# Patient Record
Sex: Male | Born: 1940 | Race: White | Hispanic: No | Marital: Married | Smoking: Former smoker
Health system: Southern US, Community
[De-identification: ages and names within clinical notes are randomized; demographics above are authoritative.]

## PROBLEM LIST (undated history)

## (undated) DIAGNOSIS — R569 Unspecified convulsions: Secondary | ICD-10-CM

## (undated) DIAGNOSIS — G8929 Other chronic pain: Secondary | ICD-10-CM

## (undated) DIAGNOSIS — I10 Essential (primary) hypertension: Secondary | ICD-10-CM

## (undated) DIAGNOSIS — M549 Dorsalgia, unspecified: Secondary | ICD-10-CM

---

## 2012-11-18 ENCOUNTER — Emergency Department (HOSPITAL_COMMUNITY): Payer: Medicare Other

## 2012-11-18 ENCOUNTER — Encounter (HOSPITAL_COMMUNITY): Payer: Self-pay | Admitting: Emergency Medicine

## 2012-11-18 ENCOUNTER — Emergency Department (HOSPITAL_COMMUNITY)
Admission: EM | Admit: 2012-11-18 | Discharge: 2012-11-18 | Disposition: A | Discharge status: Home / Self Care | Payer: Medicare Other | Attending: Emergency Medicine | Admitting: Emergency Medicine

## 2012-11-18 DIAGNOSIS — Z79899 Other long term (current) drug therapy: Secondary | ICD-10-CM | POA: Insufficient documentation

## 2012-11-18 DIAGNOSIS — Z87891 Personal history of nicotine dependence: Secondary | ICD-10-CM | POA: Insufficient documentation

## 2012-11-18 DIAGNOSIS — M62838 Other muscle spasm: Secondary | ICD-10-CM

## 2012-11-18 DIAGNOSIS — G8929 Other chronic pain: Secondary | ICD-10-CM | POA: Insufficient documentation

## 2012-11-18 DIAGNOSIS — M545 Low back pain, unspecified: Secondary | ICD-10-CM | POA: Insufficient documentation

## 2012-11-18 DIAGNOSIS — I10 Essential (primary) hypertension: Secondary | ICD-10-CM | POA: Insufficient documentation

## 2012-11-18 DIAGNOSIS — Z8669 Personal history of other diseases of the nervous system and sense organs: Secondary | ICD-10-CM | POA: Insufficient documentation

## 2012-11-18 DIAGNOSIS — Z7982 Long term (current) use of aspirin: Secondary | ICD-10-CM | POA: Insufficient documentation

## 2012-11-18 HISTORY — DX: Dorsalgia, unspecified: M54.9

## 2012-11-18 HISTORY — DX: Essential (primary) hypertension: I10

## 2012-11-18 HISTORY — DX: Other chronic pain: G89.29

## 2012-11-18 HISTORY — DX: Unspecified convulsions: R56.9

## 2012-11-18 LAB — POCT I-STAT, CHEM 8
Creatinine, Ser: 1.2 mg/dL (ref 0.50–1.35)
HCT: 44 % (ref 39.0–52.0)
Hemoglobin: 15 g/dL (ref 13.0–17.0)
Potassium: 4.4 mEq/L (ref 3.5–5.1)
Sodium: 140 mEq/L (ref 135–145)
TCO2: 31 mmol/L (ref 0–100)

## 2012-11-18 NOTE — ED Provider Notes (Signed)
History     CSN: [id number]  Arrival date & time 11/18/12  1511   First MD Initiated Contact with Patient 11/18/12 1541      Chief Complaint  Patient presents with  . Back Pain    HPI Pt was seen at 1600.  Per pt, c/o gradual onset and persistence of constant acute flair of his chronic low back "pain" for the past 1 week.  Denies any change in his usual chronic pain pattern.  Pain worsens with palpation of the area and body position changes. States the pain has increased since he has been attending physical therapy (as rx by his Neuro MD) over the past several weeks.  Pt states he feels his right lower thigh muscles occasionally "cramp up."  States this began after his Physical Therapist had him perform leg extensions "with heavier and heavier weights."  Denies incont/retention of bowel or bladder, no saddle anesthesia, no focal motor weakness, no tingling/numbness in extremities, no fevers, no injury, no abd pain.   The symptoms have been associated with no other complaints.     Past Medical History  Diagnosis Date  . Hypertension   . Seizures   . Chronic back pain     History reviewed. No pertinent past surgical history.   History  Substance Use Topics  . Smoking status: Former Games developer  . Smokeless tobacco: Not on file  . Alcohol Use: No    Review of Systems ROS: Statement: All systems negative except as marked or noted in the HPI; Constitutional: Negative for fever and chills. ; ; Eyes: Negative for eye pain, redness and discharge. ; ; ENMT: Negative for ear pain, hoarseness, nasal congestion, sinus pressure and sore throat. ; ; Cardiovascular: Negative for chest pain, palpitations, diaphoresis, dyspnea and peripheral edema. ; ; Respiratory: Negative for cough, wheezing and stridor. ; ; Gastrointestinal: Negative for nausea, vomiting, diarrhea, abdominal pain, blood in stool, hematemesis, jaundice and rectal bleeding. . ; ; Genitourinary: Negative for dysuria, flank pain and  hematuria. ; ; Musculoskeletal: +LBP, right thigh cramping. Negative for neck pain. Negative for swelling and trauma.; ; Skin: Negative for pruritus, rash, abrasions, blisters, bruising and skin lesion.; ; Neuro: Negative for headache, lightheadedness and neck stiffness. Negative for weakness, altered level of consciousness , altered mental status, extremity weakness, paresthesias, involuntary movement, seizure and syncope.      Allergies  Review of patient's allergies indicates no known allergies.  Home Medications   Current Outpatient Rx  Name  Route  Sig  Dispense  Refill  . aspirin EC 81 MG tablet   Oral   Take 81 mg by mouth every morning.         . Calcium Carb-Cholecalciferol (CALCIUM 600/VITAMIN D3) 600-800 MG-UNIT TABS   Oral   Take 1 tablet by mouth 2 (two) times daily.         . cyclobenzaprine (FLEXERIL) 5 MG tablet   Oral   Take 5 mg by mouth 2 (two) times daily.         Marland Kitchen lisinopril (PRINIVIL,ZESTRIL) 10 MG tablet   Oral   Take 10 mg by mouth every morning.         . traMADol (ULTRAM) 50 MG tablet   Oral   Take 50 mg by mouth every 12 (twelve) hours as needed for pain.         Marland Kitchen valproic acid (DEPAKENE) 250 MG capsule   Oral   Take 500 mg by mouth 2 (two) times daily.         Marland Kitchen  vitamin B-12 (CYANOCOBALAMIN) 1000 MCG tablet   Oral   Take 1,000 mcg by mouth every morning.           BP 145/73  Pulse 70  Temp(Src) 98.3 F (36.8 C) (Oral)  Resp 16  Ht 5\' 5"  (1.651 m)  Wt 185 lb (83.915 kg)  BMI 30.79 kg/m2  SpO2 93%  Physical Exam 1605: Physical examination:  Nursing notes reviewed; Vital signs and O2 SAT reviewed;  Constitutional: Well developed, Well nourished, Well hydrated, In no acute distress; Head:  Normocephalic, atraumatic; Eyes: EOMI, PERRL, No scleral icterus; ENMT: Mouth and pharynx normal, Mucous membranes moist; Neck: Supple, Full range of motion, No lymphadenopathy; Cardiovascular: Regular rate and rhythm, No gallop;  Respiratory: Breath sounds clear & equal bilaterally, No rales, rhonchi, wheezes.  Speaking full sentences with ease, Normal respiratory effort/excursion; Chest: Nontender, Movement normal; Abdomen: Soft, Nontender, Nondistended, Normal bowel sounds; Genitourinary: No CVA tenderness; Spine:  No midline CS, TS, LS tenderness. +mild TTP right lumbar paraspinal muscles.;; Extremities: Pulses normal, RLE muscles compartments soft. No edema, no ecchymosis, no erythema. NT right hip/knee/ankle/foot. Strong right pedal pulses, NMS intact right foot. No edema, No calf edema or asymmetry.; Neuro: AA&Ox3, Major CN grossly intact.  Speech clear. Strength 5/5 equal bilat UE's and LE's, including great toe dorsiflexion.  DTR 2/4 equal bilat UE's and LE's.  No gross sensory deficits.  Neg straight leg raises bilat. Climbs on and off stretcher by himself easily. Gait steady.;;; Skin: Color normal, Warm, Dry.   ED Course  Procedures    MDM  MDM Reviewed: previous chart, nursing note and vitals Interpretation: x-ray and labs   Results for orders placed during the hospital encounter of 11/18/12  POCT I-STAT, CHEM 8      Result Value Range   Sodium 140  135 - 145 mEq/L   Potassium 4.4  3.5 - 5.1 mEq/L   Chloride 102  96 - 112 mEq/L   BUN 27 (*) 6 - 23 mg/dL   Creatinine, Ser 1.61  0.50 - 1.35 mg/dL   Glucose, Bld 85  70 - 99 mg/dL   Calcium, Ion 0.96  0.45 - 1.30 mmol/L   TCO2 31  0 - 100 mmol/L   Hemoglobin 15.0  13.0 - 17.0 g/dL   HCT 40.9  81.1 - 91.4 %   Dg Lumbar Spine Complete 11/18/2012  *RADIOLOGY REPORT*  Clinical Data: Low back pain.  Right leg pain  LUMBAR SPINE - COMPLETE 4+ VIEW  Comparison: None.  Findings: Negative for fracture or mass.  Disc degeneration and spondylosis T12-L1, L1-2, L2-3, and L3-4.  Mild disc space narrowing L4-5 with bilateral facet degeneration.  Bilateral facet degeneration also noted at L5-S1.  Diffuse aortic calcification without aneurysm.  IMPRESSION: Moderate  degenerative change.  No acute bony abnormality.   Original Report Authenticated By: Janeece Riggers, M.D.      [phone]:  Neuro exam intact. Will have pt treat pain symptomatically at this time. Pt has gotten himself dressed and is sitting in a chair.  Wants to go home now.  Dx and testing d/w pt and family.  Questions answered.  Verb understanding, agreeable to d/c home with outpt f/u.          Laray Anger, DO 11/21/12 2224

## 2012-11-18 NOTE — ED Notes (Signed)
Pt c/o lower back pain and right thigh pain. Pt has been doing physical therapy and states the thigh pain began after his PT appointment.

## 2012-11-18 NOTE — ED Notes (Signed)
Pt c/o lower back pain x 1 week, denies injury.  Reports his neurologist sent him to an exercise facility.  Pt says pain is worse and radiates down  r leg.

## 2014-11-18 ENCOUNTER — Inpatient Hospital Stay (HOSPITAL_COMMUNITY)
Admission: EM | Admit: 2014-11-18 | Discharge: 2014-12-03 | DRG: 871 | Disposition: E | Payer: Medicare Other | Attending: Internal Medicine | Admitting: Internal Medicine

## 2014-11-18 ENCOUNTER — Inpatient Hospital Stay (HOSPITAL_COMMUNITY): Payer: Medicare Other

## 2014-11-18 ENCOUNTER — Other Ambulatory Visit (HOSPITAL_COMMUNITY): Payer: Self-pay

## 2014-11-18 ENCOUNTER — Emergency Department (HOSPITAL_COMMUNITY): Payer: Medicare Other

## 2014-11-18 ENCOUNTER — Encounter (HOSPITAL_COMMUNITY): Payer: Self-pay | Admitting: *Deleted

## 2014-11-18 DIAGNOSIS — I4891 Unspecified atrial fibrillation: Secondary | ICD-10-CM | POA: Diagnosis not present

## 2014-11-18 DIAGNOSIS — E874 Mixed disorder of acid-base balance: Secondary | ICD-10-CM | POA: Diagnosis present

## 2014-11-18 DIAGNOSIS — I10 Essential (primary) hypertension: Secondary | ICD-10-CM | POA: Diagnosis present

## 2014-11-18 DIAGNOSIS — Z452 Encounter for adjustment and management of vascular access device: Secondary | ICD-10-CM

## 2014-11-18 DIAGNOSIS — D689 Coagulation defect, unspecified: Secondary | ICD-10-CM | POA: Diagnosis not present

## 2014-11-18 DIAGNOSIS — Z789 Other specified health status: Secondary | ICD-10-CM | POA: Diagnosis present

## 2014-11-18 DIAGNOSIS — J9601 Acute respiratory failure with hypoxia: Secondary | ICD-10-CM | POA: Insufficient documentation

## 2014-11-18 DIAGNOSIS — A419 Sepsis, unspecified organism: Secondary | ICD-10-CM | POA: Diagnosis present

## 2014-11-18 DIAGNOSIS — M6282 Rhabdomyolysis: Secondary | ICD-10-CM | POA: Diagnosis present

## 2014-11-18 DIAGNOSIS — K7200 Acute and subacute hepatic failure without coma: Secondary | ICD-10-CM | POA: Diagnosis present

## 2014-11-18 DIAGNOSIS — Z7289 Other problems related to lifestyle: Secondary | ICD-10-CM | POA: Diagnosis present

## 2014-11-18 DIAGNOSIS — K760 Fatty (change of) liver, not elsewhere classified: Secondary | ICD-10-CM | POA: Diagnosis present

## 2014-11-18 DIAGNOSIS — Z7982 Long term (current) use of aspirin: Secondary | ICD-10-CM | POA: Diagnosis not present

## 2014-11-18 DIAGNOSIS — R945 Abnormal results of liver function studies: Secondary | ICD-10-CM

## 2014-11-18 DIAGNOSIS — D696 Thrombocytopenia, unspecified: Secondary | ICD-10-CM | POA: Diagnosis present

## 2014-11-18 DIAGNOSIS — J690 Pneumonitis due to inhalation of food and vomit: Secondary | ICD-10-CM | POA: Diagnosis present

## 2014-11-18 DIAGNOSIS — Z87891 Personal history of nicotine dependence: Secondary | ICD-10-CM

## 2014-11-18 DIAGNOSIS — E875 Hyperkalemia: Secondary | ICD-10-CM | POA: Diagnosis not present

## 2014-11-18 DIAGNOSIS — K7010 Alcoholic hepatitis without ascites: Secondary | ICD-10-CM | POA: Diagnosis present

## 2014-11-18 DIAGNOSIS — J189 Pneumonia, unspecified organism: Secondary | ICD-10-CM | POA: Diagnosis present

## 2014-11-18 DIAGNOSIS — Z66 Do not resuscitate: Secondary | ICD-10-CM | POA: Diagnosis not present

## 2014-11-18 DIAGNOSIS — Z4659 Encounter for fitting and adjustment of other gastrointestinal appliance and device: Secondary | ICD-10-CM

## 2014-11-18 DIAGNOSIS — L89152 Pressure ulcer of sacral region, stage 2: Secondary | ICD-10-CM | POA: Diagnosis present

## 2014-11-18 DIAGNOSIS — K7290 Hepatic failure, unspecified without coma: Secondary | ICD-10-CM | POA: Diagnosis present

## 2014-11-18 DIAGNOSIS — R6521 Severe sepsis with septic shock: Secondary | ICD-10-CM | POA: Diagnosis present

## 2014-11-18 DIAGNOSIS — Z79899 Other long term (current) drug therapy: Secondary | ICD-10-CM

## 2014-11-18 DIAGNOSIS — I248 Other forms of acute ischemic heart disease: Secondary | ICD-10-CM | POA: Diagnosis present

## 2014-11-18 DIAGNOSIS — N179 Acute kidney failure, unspecified: Secondary | ICD-10-CM | POA: Diagnosis present

## 2014-11-18 DIAGNOSIS — Z87898 Personal history of other specified conditions: Secondary | ICD-10-CM

## 2014-11-18 DIAGNOSIS — F1099 Alcohol use, unspecified with unspecified alcohol-induced disorder: Secondary | ICD-10-CM

## 2014-11-18 DIAGNOSIS — N289 Disorder of kidney and ureter, unspecified: Secondary | ICD-10-CM | POA: Insufficient documentation

## 2014-11-18 DIAGNOSIS — Z515 Encounter for palliative care: Secondary | ICD-10-CM | POA: Diagnosis not present

## 2014-11-18 DIAGNOSIS — Z978 Presence of other specified devices: Secondary | ICD-10-CM

## 2014-11-18 DIAGNOSIS — G8929 Other chronic pain: Secondary | ICD-10-CM | POA: Diagnosis present

## 2014-11-18 DIAGNOSIS — R7989 Other specified abnormal findings of blood chemistry: Secondary | ICD-10-CM | POA: Diagnosis present

## 2014-11-18 DIAGNOSIS — I370 Nonrheumatic pulmonary valve stenosis: Secondary | ICD-10-CM | POA: Diagnosis not present

## 2014-11-18 DIAGNOSIS — Z9289 Personal history of other medical treatment: Secondary | ICD-10-CM

## 2014-11-18 DIAGNOSIS — G40909 Epilepsy, unspecified, not intractable, without status epilepticus: Secondary | ICD-10-CM | POA: Diagnosis present

## 2014-11-18 DIAGNOSIS — M549 Dorsalgia, unspecified: Secondary | ICD-10-CM | POA: Diagnosis present

## 2014-11-18 DIAGNOSIS — F1090 Alcohol use, unspecified, uncomplicated: Secondary | ICD-10-CM | POA: Diagnosis present

## 2014-11-18 DIAGNOSIS — J9690 Respiratory failure, unspecified, unspecified whether with hypoxia or hypercapnia: Secondary | ICD-10-CM

## 2014-11-18 DIAGNOSIS — E869 Volume depletion, unspecified: Secondary | ICD-10-CM | POA: Diagnosis present

## 2014-11-18 DIAGNOSIS — R748 Abnormal levels of other serum enzymes: Secondary | ICD-10-CM

## 2014-11-18 DIAGNOSIS — T68XXXA Hypothermia, initial encounter: Secondary | ICD-10-CM | POA: Diagnosis present

## 2014-11-18 DIAGNOSIS — R778 Other specified abnormalities of plasma proteins: Secondary | ICD-10-CM | POA: Diagnosis present

## 2014-11-18 LAB — CBC WITH DIFFERENTIAL/PLATELET
Basophils Absolute: 0.1 10*3/uL (ref 0.0–0.1)
Basophils Relative: 1 % (ref 0–1)
EOS ABS: 0 10*3/uL (ref 0.0–0.7)
Eosinophils Relative: 0 % (ref 0–5)
HCT: 44.9 % (ref 39.0–52.0)
HEMOGLOBIN: 15.3 g/dL (ref 13.0–17.0)
LYMPHS ABS: 1.6 10*3/uL (ref 0.7–4.0)
LYMPHS PCT: 16 % (ref 12–46)
MCH: 32.8 pg (ref 26.0–34.0)
MCHC: 34.1 g/dL (ref 30.0–36.0)
MCV: 96.4 fL (ref 78.0–100.0)
Monocytes Absolute: 0.7 10*3/uL (ref 0.1–1.0)
Monocytes Relative: 7 % (ref 3–12)
Neutro Abs: 8 10*3/uL — ABNORMAL HIGH (ref 1.7–7.7)
Neutrophils Relative %: 76 % (ref 43–77)
PLATELETS: 122 10*3/uL — AB (ref 150–400)
RBC: 4.66 MIL/uL (ref 4.22–5.81)
RDW: 16 % — AB (ref 11.5–15.5)
WBC: 10.4 10*3/uL (ref 4.0–10.5)

## 2014-11-18 LAB — COMPREHENSIVE METABOLIC PANEL
ALT: 629 U/L — ABNORMAL HIGH (ref 0–53)
ANION GAP: 14 (ref 5–15)
AST: 1425 U/L — ABNORMAL HIGH (ref 0–37)
Albumin: 3 g/dL — ABNORMAL LOW (ref 3.5–5.2)
Alkaline Phosphatase: 309 U/L — ABNORMAL HIGH (ref 39–117)
BUN: 66 mg/dL — ABNORMAL HIGH (ref 6–23)
CO2: 25 mmol/L (ref 19–32)
CREATININE: 2.12 mg/dL — AB (ref 0.50–1.35)
Calcium: 13.6 mg/dL (ref 8.4–10.5)
Chloride: 103 mmol/L (ref 96–112)
GFR, EST AFRICAN AMERICAN: 34 mL/min — AB (ref >90)
GFR, EST NON AFRICAN AMERICAN: 29 mL/min — AB (ref >90)
Glucose, Bld: 97 mg/dL (ref 70–99)
Potassium: 4.7 mmol/L (ref 3.5–5.1)
SODIUM: 142 mmol/L (ref 135–145)
Total Bilirubin: 7.1 mg/dL — ABNORMAL HIGH (ref 0.3–1.2)
Total Protein: 6.2 g/dL (ref 6.0–8.3)

## 2014-11-18 LAB — RAPID URINE DRUG SCREEN, HOSP PERFORMED
AMPHETAMINES: NEGATIVE — AB
Barbiturates: NEGATIVE — AB
Benzodiazepines: NEGATIVE — AB
Cocaine: NEGATIVE — AB
OPIATES: NEGATIVE — AB
TETRAHYDROCANNABINOL: NEGATIVE — AB

## 2014-11-18 LAB — URINALYSIS, ROUTINE W REFLEX MICROSCOPIC
Glucose, UA: NEGATIVE mg/dL
KETONES UR: NEGATIVE mg/dL
LEUKOCYTES UA: NEGATIVE
Nitrite: NEGATIVE
PROTEIN: NEGATIVE mg/dL
Specific Gravity, Urine: >1.03 — ABNORMAL HIGH (ref 1.005–1.030)
Urobilinogen, UA: 0.2 mg/dL (ref 0.0–1.0)
pH: 5 (ref 5.0–8.0)

## 2014-11-18 LAB — URINE MICROSCOPIC-ADD ON

## 2014-11-18 LAB — TROPONIN I: TROPONIN I: 0.24 ng/mL — AB (ref <0.031)

## 2014-11-18 MED ORDER — DEXTROSE 5 % IV SOLN
1.0000 g | Freq: Once | INTRAVENOUS | Status: AC
  Administered 2014-11-18: 1 g via INTRAVENOUS
  Filled 2014-11-18: qty 10

## 2014-11-18 MED ORDER — MIDAZOLAM HCL 50 MG/10ML IJ SOLN
INTRAMUSCULAR | Status: AC
  Filled 2014-11-18: qty 1

## 2014-11-18 MED ORDER — CETYLPYRIDINIUM CHLORIDE 0.05 % MT LIQD
7.0000 mL | Freq: Two times a day (BID) | OROMUCOSAL | Status: DC
  Administered 2014-11-18: 7 mL via OROMUCOSAL

## 2014-11-18 MED ORDER — ASPIRIN EC 81 MG PO TBEC
81.0000 mg | DELAYED_RELEASE_TABLET | Freq: Every morning | ORAL | Status: DC
  Filled 2014-11-18: qty 1

## 2014-11-18 MED ORDER — IPRATROPIUM-ALBUTEROL 0.5-2.5 (3) MG/3ML IN SOLN
3.0000 mL | Freq: Once | RESPIRATORY_TRACT | Status: AC
  Administered 2014-11-18: 3 mL via RESPIRATORY_TRACT
  Filled 2014-11-18: qty 3

## 2014-11-18 MED ORDER — FENTANYL CITRATE 0.05 MG/ML IJ SOLN
50.0000 ug | INTRAMUSCULAR | Status: DC | PRN
Start: 2014-11-18 — End: 2014-11-21
  Administered 2014-11-19 (×3): 50 ug via INTRAVENOUS
  Filled 2014-11-18 (×3): qty 2

## 2014-11-18 MED ORDER — CHLORHEXIDINE GLUCONATE 0.12 % MT SOLN
15.0000 mL | Freq: Two times a day (BID) | OROMUCOSAL | Status: DC
  Administered 2014-11-19 – 2014-11-21 (×6): 15 mL via OROMUCOSAL
  Filled 2014-11-18 (×5): qty 15

## 2014-11-18 MED ORDER — ETOMIDATE 2 MG/ML IV SOLN
INTRAVENOUS | Status: AC
  Administered 2014-11-19: 20 mg
  Filled 2014-11-18: qty 20

## 2014-11-18 MED ORDER — ONDANSETRON HCL 4 MG/2ML IJ SOLN: 4.0000 mg | Freq: Four times a day (QID) | INTRAMUSCULAR | Status: DC | PRN

## 2014-11-18 MED ORDER — PIPERACILLIN-TAZOBACTAM 3.375 G IVPB
INTRAVENOUS | Status: AC
  Filled 2014-11-18: qty 50

## 2014-11-18 MED ORDER — ROCURONIUM BROMIDE 50 MG/5ML IV SOLN
INTRAVENOUS | Status: AC
  Filled 2014-11-18: qty 2

## 2014-11-18 MED ORDER — FOLIC ACID 1 MG PO TABS
1.0000 mg | ORAL_TABLET | Freq: Every day | ORAL | Status: DC
  Administered 2014-11-19 – 2014-11-21 (×3): 1 mg via ORAL
  Filled 2014-11-18 (×3): qty 1

## 2014-11-18 MED ORDER — ALBUTEROL SULFATE (2.5 MG/3ML) 0.083% IN NEBU
2.5000 mg | INHALATION_SOLUTION | RESPIRATORY_TRACT | Status: DC | PRN
  Administered 2014-11-18 – 2014-11-19 (×3): 2.5 mg via RESPIRATORY_TRACT
  Filled 2014-11-18 (×3): qty 3

## 2014-11-18 MED ORDER — LORAZEPAM 2 MG/ML IJ SOLN
1.0000 mg | Freq: Four times a day (QID) | INTRAMUSCULAR | Status: DC | PRN
  Filled 2014-11-18: qty 1

## 2014-11-18 MED ORDER — SODIUM CHLORIDE 0.9 % IJ SOLN
3.0000 mL | Freq: Two times a day (BID) | INTRAMUSCULAR | Status: DC
  Administered 2014-11-18 – 2014-11-20 (×4): 3 mL via INTRAVENOUS

## 2014-11-18 MED ORDER — MIDAZOLAM HCL 2 MG/2ML IJ SOLN: 1.0000 mg | INTRAMUSCULAR | Status: DC | PRN

## 2014-11-18 MED ORDER — HEPARIN SODIUM (PORCINE) 5000 UNIT/ML IJ SOLN
5000.0000 [IU] | Freq: Three times a day (TID) | INTRAMUSCULAR | Status: DC
  Administered 2014-11-19 – 2014-11-21 (×8): 5000 [IU] via SUBCUTANEOUS
  Filled 2014-11-18 (×9): qty 1

## 2014-11-18 MED ORDER — ADULT MULTIVITAMIN W/MINERALS CH
1.0000 | ORAL_TABLET | Freq: Every day | ORAL | Status: DC
  Administered 2014-11-19 – 2014-11-21 (×3): 1 via ORAL
  Filled 2014-11-18 (×3): qty 1

## 2014-11-18 MED ORDER — PANTOPRAZOLE SODIUM 40 MG IV SOLR
40.0000 mg | Freq: Two times a day (BID) | INTRAVENOUS | Status: DC
  Administered 2014-11-19 – 2014-11-21 (×6): 40 mg via INTRAVENOUS
  Filled 2014-11-18 (×7): qty 40

## 2014-11-18 MED ORDER — SUCCINYLCHOLINE CHLORIDE 20 MG/ML IJ SOLN
INTRAMUSCULAR | Status: AC
  Administered 2014-11-19: 120 mg
  Filled 2014-11-18: qty 1

## 2014-11-18 MED ORDER — LIDOCAINE HCL (CARDIAC) 20 MG/ML IV SOLN
INTRAVENOUS | Status: AC
  Filled 2014-11-18: qty 5

## 2014-11-18 MED ORDER — VANCOMYCIN HCL IN DEXTROSE 1-5 GM/200ML-% IV SOLN
1000.0000 mg | Freq: Once | INTRAVENOUS | Status: AC
Start: 2014-11-18 — End: 2014-11-19
  Administered 2014-11-19: 1000 mg via INTRAVENOUS
  Filled 2014-11-18: qty 200

## 2014-11-18 MED ORDER — CETYLPYRIDINIUM CHLORIDE 0.05 % MT LIQD
7.0000 mL | Freq: Four times a day (QID) | OROMUCOSAL | Status: DC
  Administered 2014-11-19 – 2014-11-21 (×11): 7 mL via OROMUCOSAL

## 2014-11-18 MED ORDER — FENTANYL CITRATE 0.05 MG/ML IJ SOLN: 50.0000 ug | INTRAMUSCULAR | Status: DC | PRN

## 2014-11-18 MED ORDER — PIPERACILLIN-TAZOBACTAM 3.375 G IVPB
3.3750 g | Freq: Three times a day (TID) | INTRAVENOUS | Status: DC
  Administered 2014-11-19 – 2014-11-21 (×8): 3.375 g via INTRAVENOUS
  Filled 2014-11-18 (×14): qty 50

## 2014-11-18 MED ORDER — DEXTROSE-NACL 5-0.9 % IV SOLN
INTRAVENOUS | Status: DC
  Administered 2014-11-18 – 2014-11-19 (×3): via INTRAVENOUS

## 2014-11-18 MED ORDER — CALCITONIN (SALMON) 200 UNIT/ML IJ SOLN
50.0000 [IU] | Freq: Once | INTRAMUSCULAR | Status: AC
  Administered 2014-11-18: 50 [IU] via SUBCUTANEOUS

## 2014-11-18 MED ORDER — SODIUM CHLORIDE 0.9 % IV BOLUS (SEPSIS): 1000.0000 mL | Freq: Once | INTRAVENOUS | Status: DC

## 2014-11-18 MED ORDER — THIAMINE HCL 100 MG/ML IJ SOLN
100.0000 mg | Freq: Every day | INTRAMUSCULAR | Status: DC
  Administered 2014-11-19: 100 mg via INTRAVENOUS
  Filled 2014-11-18 (×3): qty 1

## 2014-11-18 MED ORDER — CALCITONIN (SALMON) 200 UNIT/ML IJ SOLN
50.0000 [IU] | Freq: Once | INTRAMUSCULAR | Status: DC
  Filled 2014-11-18: qty 0.25

## 2014-11-18 MED ORDER — VANCOMYCIN HCL IN DEXTROSE 1-5 GM/200ML-% IV SOLN
INTRAVENOUS | Status: AC
Start: 2014-11-18 — End: 2014-11-18
  Filled 2014-11-18: qty 200

## 2014-11-18 MED ORDER — SODIUM CHLORIDE 0.9 % IV BOLUS (SEPSIS)
1000.0000 mL | Freq: Once | INTRAVENOUS | Status: AC
  Administered 2014-11-18: 1000 mL via INTRAVENOUS

## 2014-11-18 MED ORDER — VITAMIN B-1 100 MG PO TABS
100.0000 mg | ORAL_TABLET | Freq: Every day | ORAL | Status: DC
  Administered 2014-11-20 – 2014-11-21 (×2): 100 mg via ORAL
  Filled 2014-11-18 (×2): qty 1

## 2014-11-18 MED ORDER — DEXTROSE 5 % IV SOLN
500.0000 mg | Freq: Once | INTRAVENOUS | Status: AC
  Administered 2014-11-18: 500 mg via INTRAVENOUS
  Filled 2014-11-18: qty 500

## 2014-11-18 MED ORDER — ONDANSETRON HCL 4 MG PO TABS: 4.0000 mg | ORAL_TABLET | Freq: Four times a day (QID) | ORAL | Status: DC | PRN

## 2014-11-18 MED ORDER — LORAZEPAM 1 MG PO TABS: 1.0000 mg | ORAL_TABLET | Freq: Four times a day (QID) | ORAL | Status: DC | PRN

## 2014-11-18 NOTE — H&P (Signed)
[facility] History and Physical  Jeffrey Wolf ZOX:[id number] DOB: 1941/05/12    PCP:   ADDIS,DANIEL, DO   Chief Complaint: Found down at home for unclear amount of time.  HPI: Jeffrey Wolf is an 74 y.o. male with very scanty history, as he was found down when neighbor asked EMS to check on his well being as they hadn't seen him.  EMS arrived, found him down, covered with urine and feces.  There was no family around and he lives alone.  When I saw him, he was able to answer some questions, knows his name, said he drinks Man[place]av[place], not daily, but a little confused.  He said he had hx of seizure, but a long time ago, and that he is not taking any medications.  In the ER, he was found to be hyperthermic, with T of 94.1.  Further ER evaluation showed that his calcium was 13, WBC of 10K, normal Hb, and platelet count of 120K.  His Cr was elevated to 2.12 with BUN of 66.  His LFTs were elevated with AST of 1425, ALT 629, APhos 309, and total bili 7.1.  His CXR showed possible pul edema vs airspace disease. He has troponin of 0.24, with EKG showing ST, but no acute ST T changes.  He was given IVF, Bear hugger, IV Rocephin and IV Zithromax, and hospitalist was asked to admit him for further evalaution and Tx.    Rewiew of Systems:  Constitutional: Negative for malaise, fever and chills. No significant weight loss or weight gain Eyes: Negative for eye pain, redness and discharge, diplopia, visual changes, or flashes of light. ENMT: Negative for ear pain, hoarseness, nasal congestion, sinus pressure and sore throat. No headaches; tinnitus, drooling, or problem swallowing. Cardiovascular: Negative for chest pain, palpitations, diaphoresis, dyspnea and peripheral edema. ; No orthopnea, PND Respiratory: Negative for cough, hemoptysis, wheezing and stridor. No pleuritic chestpain. Gastrointestinal: Negative for nausea, vomiting, diarrhea, constipation, abdominal pain, melena, blood in stool,  hematemesis, jaundice and rectal bleeding.    Genitourinary: Negative for frequency, dysuria, incontinence,flank pain and hematuria; Musculoskeletal: Negative for back pain and neck pain. Negative for swelling and trauma.;  Skin: . Negative for pruritus, rash, abrasions, bruising and skin lesion.; ulcerations Neuro: Negative for headache, lightheadedness and neck stiffness. Negative for weakness, altered level of consciousness , altered mental status, extremity weakness, burning feet, involuntary movement, seizure and syncope.  Psych: negative for anxiety, depression, insomnia, tearfulness, panic attacks, hallucinations, paranoia, suicidal or homicidal ideation    Past Medical History  Diagnosis Date  . Hypertension   . Seizures   . Chronic back pain    HOME MEDS: Prior to Admission medications   Medication Sig Start Date End Date Taking? Authorizing Provider  aspirin EC 81 MG tablet Take 81 mg by mouth every morning.    Historical Provider, MD  Calcium Carb-Cholecalciferol (CALCIUM 600/VITAMIN D3) 600-800 MG-UNIT TABS Take 1 tablet by mouth 2 (two) times daily.    Historical Provider, MD  cyclobenzaprine (FLEXERIL) 5 MG tablet Take 5 mg by mouth 2 (two) times daily.    Historical Provider, MD  lisinopril (PRINIVIL,ZESTRIL) 10 MG tablet Take 10 mg by mouth every morning.    Historical Provider, MD  traMADol (ULTRAM) 50 MG tablet Take 50 mg by mouth every 12 (twelve) hours as needed for pain.    Historical Provider, MD  valproic acid (DEPAKENE) 250 MG capsule Take 500 mg by mouth 2 (two) times daily.    Historical Provider, MD  vitamin  B-12 (CYANOCOBALAMIN) 1000 MCG tablet Take 1,000 mcg by mouth every morning.    Historical Provider, MD     Allergies:  No Known Allergies  Social History:   reports that he has quit smoking. He does not have any smokeless tobacco history on file. He reports that he does  drink alcohol   Family History: History reviewed. No pertinent family  history.   Physical Exam: Filed Vitals:   29-Nov-2014 1930 2014-11-29 1941 2014/11/29 2030 November 29, 2014 2054  BP: 120/62  102/44   Pulse: 111  113   Temp:    98.3 F (36.8 C)  TempSrc:    Core (Comment)  Resp:      SpO2: 94% 98% 94%    Blood pressure 102/44, pulse 113, temperature 98.3 F (36.8 C), temperature source Core (Comment), resp. rate 28, SpO2 94 %.  GEN:  Pleasant  patient lying in the stretcher in no acute distress; cooperative with exam. PSYCH:  alert and oriented x4; does not appear anxious or depressed; affect is appropriate. HEENT: Mucous membranes pink and anicteric; PERRLA; EOM intact; no cervical lymphadenopathy nor thyromegaly or carotid bruit; no JVD; There were no stridor. Neck is very supple. Breasts:: Not examined CHEST WALL: No tenderness CHEST: Normal respiration, BS is coarse. HEART: Tachy.  There are no murmur, rub, or gallops.   BACK: No kyphosis or scoliosis; no CVA tenderness ABDOMEN: soft and non-tender; no masses, no organomegaly, normal abdominal bowel sounds; no pannus; no intertriginous candida. There is no rebound and no distention. Rectal Exam: Not done EXTREMITIES: No bone or joint deformity; age-appropriate arthropathy of the hands and knees; no edema; no ulcerations.  There is no calf tenderness. Genitalia: not examined PULSES: 2+ and symmetric SKIN: Normal hydration no rash or ulceration CNS: Cranial nerves 2-12 grossly intact no focal lateralizing neurologic deficit.  Speech is fluent; uvula elevated with phonation, facial symmetry and tongue midline. DTR are normal bilaterally, cerebella exam is intact, barbinski is negative and strengths are equaled bilaterally.  No sensory loss.   Labs on Admission:  Basic Metabolic Panel:  Recent Labs Lab November 29, 2014 1842  NA 142  K 4.7  CL 103  CO2 25  GLUCOSE 97  BUN 66*  CREATININE 2.12*  CALCIUM 13.6*   Liver Function Tests:  Recent Labs Lab 2014/11/29 1842  AST 1425*  ALT 629*  ALKPHOS 309*   BILITOT 7.1*  PROT 6.2  ALBUMIN 3.0*  CBC:  Recent Labs Lab 2014-11-29 1842  WBC 10.4  NEUTROABS 8.0*  HGB 15.3  HCT 44.9  MCV 96.4  PLT 122*   Cardiac Enzymes:  Recent Labs Lab 2014/11/29 1842  TROPONINI 0.24*    CBG: No results for input(s): GLUCAP in the last 168 hours.   Radiological Exams on Admission: Ct Abdomen Pelvis Wo Contrast  29-Nov-2014   CLINICAL DATA:  Elevated liver function tests.  EXAM: CT ABDOMEN AND PELVIS WITHOUT CONTRAST  TECHNIQUE: Multidetector CT imaging of the abdomen and pelvis was performed following the standard protocol without IV contrast.  COMPARISON:  None.  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 Mild right posterior basilar opacity is noted concerning for pneumonia or subsegmental atelectasis.  Liver measures 26 cm in diameter consistent with hepatomegaly. The spleen and pancreas appear normal. No definite gallstones or gallbladder inflammation is noted. Adrenal glands and kidneys appear normal. No hydronephrosis or renal obstruction is noted. Atherosclerotic calcifications of abdominal aorta are noted without aneurysm formation. There is no evidence of bowel obstruction.  Sigmoid diverticulosis is noted without inflammation. Urinary bladder is decompressed secondary to Foley catheter. No abnormal fluid collection is noted. No significant adenopathy is noted.  IMPRESSION: Hepatomegaly is noted without focal abnormality seen. Right lower lobe pneumonia or atelectasis is noted. Sigmoid diverticulosis is noted without inflammation.   Electronically Signed   By: Lupita Raider, M.D.   On: 11/13/2014 20:16   Ct Head Wo Contrast  11/20/2014   CLINICAL DATA:  Altered mental status.  EXAM: CT HEAD WITHOUT CONTRAST  TECHNIQUE: Contiguous axial images were obtained from the base of the skull through the vertex without intravenous contrast.  COMPARISON:  None.  FINDINGS: Bony calvarium appears intact. Mild diffuse cortical  atrophy is noted. Minimal chronic ischemic white matter disease is noted. No mass effect or midline shift is noted. Ventricular size is within normal limits. There is no evidence of mass lesion, hemorrhage or acute infarction.  IMPRESSION: Mild diffuse cortical atrophy. Minimal chronic ischemic white matter disease. No acute intracranial abnormality seen.   Electronically Signed   By: Lupita Raider, M.D.   On: 11/27/2014 19:26   Dg Chest Port 1 View  11/03/2014   CLINICAL DATA:  Hypothermia, altered mental status  EXAM: PORTABLE CHEST - 1 VIEW  COMPARISON:  None.  FINDINGS: Bilateral interstitial and alveolar airspace opacities. No pleural effusion or pneumothorax. Normal cardiomediastinal silhouette. 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  IMPRESSION: Bilateral interstitial and alveolar airspace opacities which may reflect mild pulmonary edema versus infection.   Electronically Signed   By: Elige Ko   On: 11/26/2014 18:49    EKG: Independently reviewed. ST at 110, no acute ST T changes.    Assessment/Plan  PLAN:  Patient was found down at home, presenting with clinical picture of sepsis, likely from pneumonia, with possible aspiration.  He has elevated LFTs, and it is possible he may have had a seizure.   His slight elevation of troponin likely from sepsis.  Will admit him for further work up.  See problem list:  Present on Admission:  . Sepsis:  Need to broaden antibiotic coverage:  Will give [employer].  He could have aspiration PNA.  CT of the abdomen did not show anything acute, but did show infiltrate in the lung.   Marland Kitchen HTN (hypertension):  BP is soft.  If it is lower, and IVF is not holding him, will start dopamine.  Will hold Lisionopril due to AKI, and hypotension.  . Alcohol use:  CIWA, and follow LFT tomorrow.  Hepatitis panel sent, along with NH3. I don't think he has hepatorenal syndrome.  . Community acquired pneumonia:  He was given Rocephin and Zithromax, but  I changed to Van/Zosyn, as I think he may have aspiration PNA. And wanted broader coverage.   . Elevated liver function tests:  Consider GI consult.  Will obtain RUQ [place] as well.  Could be from his alcohol use.  Will obtain tylenol level.   . Hypothermia: Sepsis likely. Could be from seizure as well.  Marland Kitchen AKI (acute kidney injury):  Suspect this will recover with holding Lisinopril and give IVF>  . Elevated troponin:  Doesn't fit ACS, likely sepsis.  Will follow troponin levels.  Obtain ECHO.  Possible seizure:  Will get EEG.  He said he had been off seizure medication a long time.  Will hold off on Valproic acid, check level.   Consult neurology.  Obtain more info when available.   Other plans as per orders.  Code Status:  FULL Unk LightningODE.    Flynn Gwyn, MD. [facility] Pager [phone] 7pm to 7am.  12/01/2014, 9:07 PM

## 2014-11-18 NOTE — ED Notes (Signed)
Bair Hugger stopped at this time. Patients core temperature is 98.9 at this time

## 2014-11-18 NOTE — ED Notes (Signed)
Pt has 2 stage 1-2 pressure ulcers above the sacrum, approx 1.5 inch diameter.

## 2014-11-18 NOTE — ED Provider Notes (Signed)
11:24 PM 11/08/2014 I was called to ICU to intubate patient for respiratory failure On arrival, pt obtunded, tachypneic and hypoxic   INTUBATION Performed by: Joya GaskinsWICKLINE,Jannetta Massey W  Required items: required devices, and special equipment available Patient identity confirmed: provided demographic data and hospital-assigned identification number Time out: deferred due to emergent procedure  Indications: respiratory failure  Intubation method: Glidescope Laryngoscopy   Preoxygenation: BVM  Sedatives: Etomidate Paralytic: Succinylcholine  Tube Size: 7.5 cuffed  Post-procedure assessment: chest rise and ETCO2 monitor Breath sounds: equal and absent over the epigastrium Tube secured with: ETT holder Patient tolerated the procedure well with no immediate complications.     Zadie Rhineonald Promise Weldin, MD 11/30/2014 [phone]

## 2014-11-18 NOTE — ED Notes (Signed)
Pt brought to ER by EMS after doing a well check. The pt was found in the floor beside of his bed, soiled and wet with feces and urine for unknown amt of time. The pt is oriented to name and DOB but disoriented to situation. Pt denies pain, tongue and oral mucosa very pale and dry. CBG 98.

## 2014-11-18 NOTE — ED Notes (Signed)
CRITICAL VALUE ALERT  Critical value received:  Calcium 13.6  Date of notification:  Sep 12, 2014  Time of notification:  1935  Critical value read back: yes  Nurse who received alert:  Juanita LasterAmanda Issaac Shipper, RN  MD notified (1st page):  Adriana Simasook  Time of first page:  1941  MD notified (2nd page):  Time of second page:  Responding MD:  Adriana Simasook  Time MD responded:  [phone]

## 2014-11-18 NOTE — Progress Notes (Signed)
Called to see patient re increase work of breathing. He is breathing at 40 x per minutes. Sat was at 86 %, BP 110, HR 120. He was having copious sputum. He has impending respiratory failure. Will request EDP to intubate him.

## 2014-11-18 NOTE — ED Provider Notes (Signed)
CSN: [id number]     Arrival date & time 11/17/2014  1745 History   First MD Initiated Contact with Patient 11/20/2014 1750     Chief Complaint  Patient presents with  . Altered Mental Status     (Consider location/radiation/quality/duration/timing/severity/associated sxs/prior Treatment) HPI.... Level V caveat for urgent need for intervention. Patient found on floor at home. He was incontinent of feces and urine. Unknown past medical history. Patient is unable to give history.  Past Medical History  Diagnosis Date  . Hypertension   . Seizures   . Chronic back pain    History reviewed. No pertinent past surgical history. History reviewed. No pertinent family history. History  Substance Use Topics  . Smoking status: Former Games developer  . Smokeless tobacco: Not on file  . Alcohol Use: No    Review of Systems  Unable to perform ROS: Acuity of condition      Allergies  Review of patient's allergies indicates no known allergies.  Home Medications   Prior to Admission medications   Medication Sig Start Date End Date Taking? Authorizing Provider  aspirin EC 81 MG tablet Take 81 mg by mouth every morning.    Historical Provider, MD  Calcium Carb-Cholecalciferol (CALCIUM 600/VITAMIN D3) 600-800 MG-UNIT TABS Take 1 tablet by mouth 2 (two) times daily.    Historical Provider, MD  cyclobenzaprine (FLEXERIL) 5 MG tablet Take 5 mg by mouth 2 (two) times daily.    Historical Provider, MD  lisinopril (PRINIVIL,ZESTRIL) 10 MG tablet Take 10 mg by mouth every morning.    Historical Provider, MD  traMADol (ULTRAM) 50 MG tablet Take 50 mg by mouth every 12 (twelve) hours as needed for pain.    Historical Provider, MD  valproic acid (DEPAKENE) 250 MG capsule Take 500 mg by mouth 2 (two) times daily.    Historical Provider, MD  vitamin B-12 (CYANOCOBALAMIN) 1000 MCG tablet Take 1,000 mcg by mouth every morning.    Historical Provider, MD   BP 104/60 mmHg  Pulse 114  Temp(Src) 98.9 F (37.2 C)  (Core (Comment))  Resp 28  SpO2 94% Physical Exam  Constitutional:  Hypothermic, appears ill  HENT:  Head: Normocephalic and atraumatic.  Eyes: Conjunctivae and EOM are normal. Pupils are equal, round, and reactive to light.  Neck: Normal range of motion. Neck supple.  Cardiovascular: Normal rate and regular rhythm.   Pulmonary/Chest: Effort normal.  Decreased breath sounds bilaterally  Abdominal: Soft. Bowel sounds are normal.  Musculoskeletal: Normal range of motion.  Neurological:  Able to speak  Skin:  Sacral decubitus ulcer  Psychiatric:  Flat affect  Nursing note and vitals reviewed.   ED Course  Procedures (including critical care time) Labs Review Labs Reviewed  URINALYSIS, ROUTINE W REFLEX MICROSCOPIC - Abnormal; Notable for the following:    Specific Gravity, Urine >1.030 (*)    Hgb urine dipstick MODERATE (*)    Bilirubin Urine MODERATE (*)    All other components within normal limits  COMPREHENSIVE METABOLIC PANEL - Abnormal; Notable for the following:    BUN 66 (*)    Creatinine, Ser 2.12 (*)    Calcium 13.6 (*)    Albumin 3.0 (*)    AST 1425 (*)    ALT 629 (*)    Alkaline Phosphatase 309 (*)    Total Bilirubin 7.1 (*)    GFR calc non Af Amer 29 (*)    GFR calc Af Amer 34 (*)    All other components within normal limits  TROPONIN  I - Abnormal; Notable for the following:    Troponin I 0.24 (*)    All other components within normal limits  CBC WITH DIFFERENTIAL/PLATELET - Abnormal; Notable for the following:    RDW 16.0 (*)    Platelets 122 (*)    Neutro Abs 8.0 (*)    All other components within normal limits  URINE MICROSCOPIC-ADD ON - Abnormal; Notable for the following:    Bacteria, UA FEW (*)    Casts HYALINE CASTS (*)    All other components within normal limits  CULTURE, BLOOD (ROUTINE X 2)  CULTURE, BLOOD (ROUTINE X 2)    Imaging Review Ct Abdomen Pelvis Wo Contrast  11/20/2014   CLINICAL DATA:  Elevated liver function tests.  EXAM: CT  ABDOMEN AND PELVIS WITHOUT CONTRAST  TECHNIQUE: Multidetector CT imaging of the abdomen and pelvis was performed following the standard protocol without IV contrast.  COMPARISON:  None.  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 Mild right posterior basilar opacity is noted concerning for pneumonia or subsegmental atelectasis.  Liver measures 26 cm in diameter consistent with hepatomegaly. The spleen and pancreas appear normal. No definite gallstones or gallbladder inflammation is noted. Adrenal glands and kidneys appear normal. No hydronephrosis or renal obstruction is noted. Atherosclerotic calcifications of abdominal aorta are noted without aneurysm formation. There is no evidence of bowel obstruction. Sigmoid diverticulosis is noted without inflammation. Urinary bladder is decompressed secondary to Foley catheter. No abnormal fluid collection is noted. No significant adenopathy is noted.  IMPRESSION: Hepatomegaly is noted without focal abnormality seen. Right lower lobe pneumonia or atelectasis is noted. Sigmoid diverticulosis is noted without inflammation.   Electronically Signed   By: Lupita Raider, M.D.   On: 11/04/2014 20:16   Ct Head Wo Contrast  11/20/2014   CLINICAL DATA:  Altered mental status.  EXAM: CT HEAD WITHOUT CONTRAST  TECHNIQUE: Contiguous axial images were obtained from the base of the skull through the vertex without intravenous contrast.  COMPARISON:  None.  FINDINGS: Bony calvarium appears intact. Mild diffuse cortical atrophy is noted. Minimal chronic ischemic white matter disease is noted. No mass effect or midline shift is noted. Ventricular size is within normal limits. There is no evidence of mass lesion, hemorrhage or acute infarction.  IMPRESSION: Mild diffuse cortical atrophy. Minimal chronic ischemic white matter disease. No acute intracranial abnormality seen.   Electronically Signed   By: Lupita Raider, M.D.   On: 11/25/2014 19:26   Dg  Chest Port 1 View  12/02/2014   CLINICAL DATA:  Hypothermia, altered mental status  EXAM: PORTABLE CHEST - 1 VIEW  COMPARISON:  None.  FINDINGS: Bilateral interstitial and alveolar airspace opacities. No pleural effusion or pneumothorax. Normal cardiomediastinal silhouette. 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  IMPRESSION: Bilateral interstitial and alveolar airspace opacities which may reflect mild pulmonary edema versus infection.   Electronically Signed   By: Elige Ko   On: 11/20/2014 18:49     EKG Interpretation   Date/Time:  Thursday November 18 2014 17:50:35 EDT Ventricular Rate:  106 PR Interval:  133 QRS Duration: 86 QT Interval:  336 QTC Calculation: 446 R Axis:   -70 Text Interpretation:  Sinus tachycardia Abnormal R-wave progression, early  transition Inferior infarct, old Baseline wander in lead(s) V6 Confirmed  by Delois Tolbert  MD, Marenda Accardi ([id number]) on 11/15/2014 6:44:45 PM     CRITICAL CARE Performed by: Donnetta Hutching Total critical care time: 50 Critical care time was exclusive  of separately billable procedures and treating other patients. Critical care was necessary to treat or prevent imminent or life-threatening deterioration. Critical care was time spent personally by me on the following activities: development of treatment plan with patient and/or surrogate as well as nursing, discussions with consultants, evaluation of patient's response to treatment, examination of patient, obtaining history from patient or surrogate, ordering and performing treatments and interventions, ordering and review of laboratory studies, ordering and review of radiographic studies, pulse oximetry and re-evaluation of patient's condition. MDM   Final diagnoses:  Hypothermia  Elevated liver function tests  Community acquired pneumonia  Hypercalcemia  Renal insufficiency        Donnetta HutchingBrian Nahsir Venezia, MD March 26, 2015 2159

## 2014-11-18 NOTE — Progress Notes (Signed)
Upon arrival to [facility] pt vital was hr 120's rr 50 spo2 85% pt in respiratory distress and labored breathing and decreased LOC. BS rhonchus. I suctioned and obtained copious amount of thick yellow/green secretions. Pt coughing up the same secretions. Nurse and I decided that the MD need to be called about the patient's declining in condition. MD come to assess pt and the decision made to intubation. Pt placed on ventilator and ABG pending

## 2014-11-18 NOTE — Progress Notes (Signed)
ANTIBIOTIC CONSULT NOTE   Pharmacy Consult for Vancomycin  Indication: pneumonia, sepsis  No Known Allergies  Patient Measurements: Height: 6' (182.9 cm) Weight: 173 lb 11.6 oz (78.8 kg) IBW/kg (Calculated) : 77.6  Vital Signs: Temp: 98.9 F (37.2 C) (03/17 2131) Temp Source: Core (Comment) (03/17 2131) BP: 118/65 mmHg (03/17 2200) Pulse Rate: 115 (03/17 2200) Intake/Output from previous day:   Intake/Output from this shift: Total I/O In: 3300 [I.V.:3300] Out: 125 [Urine:125]  Labs:  Recent Labs  12/02/2014 1842  WBC 10.4  HGB 15.3  PLT 122*  CREATININE 2.12*   Estimated Creatinine Clearance: 33.6 mL/min (by C-G formula based on Cr of 2.12). No results for input(s): VANCOTROUGH, VANCOPEAK, VANCORANDOM, GENTTROUGH, GENTPEAK, GENTRANDOM, TOBRATROUGH, TOBRAPEAK, TOBRARND, AMIKACINPEAK, AMIKACINTROU, AMIKACIN in the last 72 hours.   Microbiology: Recent Results (from the past 720 hour(s))  Blood culture (routine x 2)     Status: None (Preliminary result)   Collection Time: 11/11/2014  8:55 PM  Result Value Ref Range Status   Specimen Description RIGHT ANTECUBITAL  Final   Special Requests BOTTLES DRAWN AEROBIC AND ANAEROBIC 6CC  Final   Culture PENDING  Incomplete   Report Status PENDING  Incomplete  Blood culture (routine x 2)     Status: None (Preliminary result)   Collection Time: 11/09/2014  9:05 PM  Result Value Ref Range Status   Specimen Description BLOOD RIGHT HAND  Final   Special Requests BOTTLES DRAWN AEROBIC AND ANAEROBIC 6CC  Final   Culture PENDING  Incomplete   Report Status PENDING  Incomplete    Anti-infectives    Start     Dose/Rate Route Frequency Ordered Stop   11/19/14 0000  piperacillin-tazobactam (ZOSYN) IVPB 3.375 g     3.375 g 12.5 mL/hr over 240 Minutes Intravenous Every 8 hours 11/05/2014 2250     11/28/2014 1915  cefTRIAXone (ROCEPHIN) 1 g in dextrose 5 % 50 mL IVPB     1 g 100 mL/hr over 30 Minutes Intravenous  Once 11/12/2014 1908 11/14/2014  2023   12/02/2014 1915  azithromycin (ZITHROMAX) 500 mg in dextrose 5 % 250 mL IVPB     500 mg 250 mL/hr over 60 Minutes Intravenous  Once 11/20/2014 1908 11/13/2014 2049      Assessment: 74 yo M found unresponsive at home.  He was hypothermic on admission.   CXR + possible PNA.  Concern for aspiration so CAP coverage is being broadened to Vanc + Zosyn for anaerobic coverage.  Acute renal failure noted. NCrCl ~ 730ml/min.  Recent height/weight are pending.   Rocephin 3/17>>3/17 Zithromax 3/17>>3/17 Zosyn 3/17>> Vancomycin 3/17>>  Goal of Therapy:  Vancomycin trough level 15-20 mcg/ml  Eradicate infection.  Plan:  Continue Zosyn 3.375gm IV Q8h to be infused over 4hrs Vancomycin 1gm IV x1 now.  F/U patient weight for subsequent dosing Check Vancomycin trough at steady state Monitor renal function and cx data  Duration of therapy per MD Consider d/c Vancomycin & change to Zosyn + Zithromax for aspiration + community acquired PNA coverage  Elson ClanLilliston, Dung Salinger Michelle 11/03/2014,11:08 PM

## 2014-11-19 ENCOUNTER — Encounter (HOSPITAL_COMMUNITY): Payer: Self-pay

## 2014-11-19 ENCOUNTER — Inpatient Hospital Stay (HOSPITAL_COMMUNITY): Payer: Medicare Other

## 2014-11-19 DIAGNOSIS — A419 Sepsis, unspecified organism: Secondary | ICD-10-CM

## 2014-11-19 DIAGNOSIS — Z8669 Personal history of other diseases of the nervous system and sense organs: Secondary | ICD-10-CM

## 2014-11-19 DIAGNOSIS — J80 Acute respiratory distress syndrome: Secondary | ICD-10-CM

## 2014-11-19 DIAGNOSIS — J690 Pneumonitis due to inhalation of food and vomit: Secondary | ICD-10-CM

## 2014-11-19 LAB — PHOSPHORUS: PHOSPHORUS: 5.4 mg/dL — AB (ref 2.3–4.6)

## 2014-11-19 LAB — BLOOD GAS, ARTERIAL
ACID-BASE DEFICIT: 3.3 mmol/L — AB (ref 0.0–2.0)
ACID-BASE DEFICIT: 2.4 mmol/L — AB (ref 0.0–2.0)
BICARBONATE: 20.8 meq/L (ref 20.0–24.0)
BICARBONATE: 21.2 meq/L (ref 20.0–24.0)
Drawn by: 213101
Drawn by: 21310
FIO2: 100 %
FIO2: 70 %
MECHVT: 550 mL
O2 SAT: 95.1 %
O2 SAT: 93.4 %
PATIENT TEMPERATURE: 37
PEEP: 5 cmH2O
PEEP: 5 cmH2O
Patient temperature: 37.3
RATE: 12 resp/min
RATE: 12 resp/min
TCO2: 18.3 mmol/L (ref 0–100)
TCO2: 18.8 mmol/L (ref 0–100)
VT: 550 mL
pCO2 arterial: 34.8 mmHg — ABNORMAL LOW (ref 35.0–45.0)
pCO2 arterial: 32.6 mmHg — ABNORMAL LOW (ref 35.0–45.0)
pH, Arterial: 7.393 (ref 7.350–7.450)
pH, Arterial: 7.429 (ref 7.350–7.450)
pO2, Arterial: 89.5 mmHg (ref 80.0–100.0)
pO2, Arterial: 76.7 mmHg — ABNORMAL LOW (ref 80.0–100.0)

## 2014-11-19 LAB — POCT I-STAT 3, ART BLOOD GAS (G3+)
Acid-base deficit: 7 mmol/L — ABNORMAL HIGH (ref 0.0–2.0)
Acid-base deficit: 9 mmol/L — ABNORMAL HIGH (ref 0.0–2.0)
BICARBONATE: 18.8 meq/L — AB (ref 20.0–24.0)
Bicarbonate: 21 mEq/L (ref 20.0–24.0)
O2 SAT: 98 %
O2 Saturation: 95 %
PO2 ART: 132 mmHg — AB (ref 80.0–100.0)
TCO2: 23 mmol/L (ref 0–100)
TCO2: 20 mmol/L (ref 0–100)
pCO2 arterial: 52.6 mmHg — ABNORMAL HIGH (ref 35.0–45.0)
pCO2 arterial: 46.6 mmHg — ABNORMAL HIGH (ref 35.0–45.0)
pH, Arterial: 7.209 — ABNORMAL LOW (ref 7.350–7.450)
pH, Arterial: 7.213 — ABNORMAL LOW (ref 7.350–7.450)
pO2, Arterial: 93 mmHg (ref 80.0–100.0)

## 2014-11-19 LAB — COMPREHENSIVE METABOLIC PANEL
ALK PHOS: 233 U/L — AB (ref 39–117)
ALT: 573 U/L — AB (ref 0–53)
AST: 1230 U/L — ABNORMAL HIGH (ref 0–37)
Albumin: 2.4 g/dL — ABNORMAL LOW (ref 3.5–5.2)
Anion gap: 12 (ref 5–15)
BUN: 74 mg/dL — ABNORMAL HIGH (ref 6–23)
CHLORIDE: 109 mmol/L (ref 96–112)
CO2: 22 mmol/L (ref 19–32)
CREATININE: 2.24 mg/dL — AB (ref 0.50–1.35)
Calcium: 11.7 mg/dL — ABNORMAL HIGH (ref 8.4–10.5)
GFR, EST AFRICAN AMERICAN: 31 mL/min — AB (ref >90)
GFR, EST NON AFRICAN AMERICAN: 27 mL/min — AB (ref >90)
Glucose, Bld: 160 mg/dL — ABNORMAL HIGH (ref 70–99)
POTASSIUM: 4 mmol/L (ref 3.5–5.1)
SODIUM: 143 mmol/L (ref 135–145)
Total Bilirubin: 5.9 mg/dL — ABNORMAL HIGH (ref 0.3–1.2)
Total Protein: 5.1 g/dL — ABNORMAL LOW (ref 6.0–8.3)

## 2014-11-19 LAB — CBC
HCT: 38.9 % — ABNORMAL LOW (ref 39.0–52.0)
HEMATOCRIT: 39.4 % (ref 39.0–52.0)
Hemoglobin: 12.8 g/dL — ABNORMAL LOW (ref 13.0–17.0)
Hemoglobin: 13 g/dL (ref 13.0–17.0)
MCH: 31.7 pg (ref 26.0–34.0)
MCH: 31.7 pg (ref 26.0–34.0)
MCHC: 32.9 g/dL (ref 30.0–36.0)
MCHC: 33 g/dL (ref 30.0–36.0)
MCV: 96.3 fL (ref 78.0–100.0)
MCV: 96.1 fL (ref 78.0–100.0)
PLATELETS: 114 10*3/uL — AB (ref 150–400)
Platelets: 115 10*3/uL — ABNORMAL LOW (ref 150–400)
RBC: 4.04 MIL/uL — ABNORMAL LOW (ref 4.22–5.81)
RBC: 4.1 MIL/uL — ABNORMAL LOW (ref 4.22–5.81)
RDW: 16.1 % — AB (ref 11.5–15.5)
RDW: 16.2 % — ABNORMAL HIGH (ref 11.5–15.5)
WBC: 7.3 10*3/uL (ref 4.0–10.5)
WBC: 8.1 10*3/uL (ref 4.0–10.5)

## 2014-11-19 LAB — GLUCOSE, CAPILLARY
GLUCOSE-CAPILLARY: 120 mg/dL — AB (ref 70–99)
Glucose-Capillary: 129 mg/dL — ABNORMAL HIGH (ref 70–99)
Glucose-Capillary: 125 mg/dL — ABNORMAL HIGH (ref 70–99)
Glucose-Capillary: 86 mg/dL (ref 70–99)

## 2014-11-19 LAB — CREATININE, SERUM
Creatinine, Ser: 2.05 mg/dL — ABNORMAL HIGH (ref 0.50–1.35)
GFR calc Af Amer: 35 mL/min — ABNORMAL LOW (ref >90)
GFR, EST NON AFRICAN AMERICAN: 30 mL/min — AB (ref >90)

## 2014-11-19 LAB — TSH: TSH: 0.251 u[IU]/mL — AB (ref 0.350–4.500)

## 2014-11-19 LAB — MRSA PCR SCREENING
MRSA by PCR: NEGATIVE
MRSA by PCR: NEGATIVE

## 2014-11-19 LAB — ETHANOL: Alcohol, Ethyl (B): 7 mg/dL (ref 0–9)

## 2014-11-19 LAB — INFLUENZA PANEL BY PCR (TYPE A & B)
H1N1 flu by pcr: NOT DETECTED
INFLAPCR: NEGATIVE
Influenza B By PCR: NEGATIVE

## 2014-11-19 LAB — EXPECTORATED SPUTUM ASSESSMENT W GRAM STAIN, RFLX TO RESP C

## 2014-11-19 LAB — BASIC METABOLIC PANEL
Anion gap: 13 (ref 5–15)
BUN: 81 mg/dL — AB (ref 6–23)
CALCIUM: 10.7 mg/dL — AB (ref 8.4–10.5)
CO2: 21 mmol/L (ref 19–32)
Chloride: 109 mmol/L (ref 96–112)
Creatinine, Ser: 3.59 mg/dL — ABNORMAL HIGH (ref 0.50–1.35)
GFR calc non Af Amer: 15 mL/min — ABNORMAL LOW (ref >90)
GFR, EST AFRICAN AMERICAN: 18 mL/min — AB (ref >90)
GLUCOSE: 175 mg/dL — AB (ref 70–99)
POTASSIUM: 4.7 mmol/L (ref 3.5–5.1)
Sodium: 143 mmol/L (ref 135–145)

## 2014-11-19 LAB — AMMONIA: AMMONIA: 140 umol/L — AB (ref 11–32)

## 2014-11-19 LAB — PROTIME-INR
INR: 3.91 — AB (ref 0.00–1.49)
PROTHROMBIN TIME: 38.6 s — AB (ref 11.6–15.2)

## 2014-11-19 LAB — HEPATITIS PANEL, ACUTE
HCV Ab: NEGATIVE
HEP A IGM: NONREACTIVE
HEP B C IGM: NONREACTIVE
HEP B S AG: NEGATIVE

## 2014-11-19 LAB — APTT: aPTT: 42 seconds — ABNORMAL HIGH (ref 24–37)

## 2014-11-19 LAB — ACETAMINOPHEN LEVEL

## 2014-11-19 LAB — EXPECTORATED SPUTUM ASSESSMENT W REFEX TO RESP CULTURE

## 2014-11-19 LAB — MAGNESIUM: Magnesium: 2.6 mg/dL — ABNORMAL HIGH (ref 1.5–2.5)

## 2014-11-19 MED ORDER — SODIUM CHLORIDE 0.9 % IV BOLUS (SEPSIS)
1000.0000 mL | Freq: Once | INTRAVENOUS | Status: AC
  Administered 2014-11-19: 1000 mL via INTRAVENOUS

## 2014-11-19 MED ORDER — AMIODARONE HCL IN DEXTROSE 360-4.14 MG/200ML-% IV SOLN
60.0000 mg/h | INTRAVENOUS | Status: AC
Start: 2014-11-19 — End: 2014-11-20
  Administered 2014-11-19 (×2): 60 mg/h via INTRAVENOUS
  Filled 2014-11-19: qty 200

## 2014-11-19 MED ORDER — DEXTROSE 5 % IV SOLN
0.0000 ug/min | INTRAVENOUS | Status: DC
  Filled 2014-11-19: qty 4

## 2014-11-19 MED ORDER — CALCITONIN (SALMON) 200 UNIT/ACT NA SOLN
1.0000 | Freq: Every day | NASAL | Status: DC
  Administered 2014-11-20 (×2): 1 via NASAL
  Filled 2014-11-19 (×2): qty 3.7

## 2014-11-19 MED ORDER — VASOPRESSIN 20 UNIT/ML IV SOLN
0.0300 [IU]/min | INTRAVENOUS | Status: DC
  Administered 2014-11-19: 0.03 [IU]/min via INTRAVENOUS
  Filled 2014-11-19 (×2): qty 2

## 2014-11-19 MED ORDER — FUROSEMIDE 10 MG/ML IJ SOLN
40.0000 mg | Freq: Once | INTRAMUSCULAR | Status: AC
Start: 2014-11-19 — End: 2014-11-19
  Administered 2014-11-19: 40 mg via INTRAVENOUS
  Filled 2014-11-19: qty 4

## 2014-11-19 MED ORDER — AMIODARONE LOAD VIA INFUSION
150.0000 mg | Freq: Once | INTRAVENOUS | Status: AC
  Administered 2014-11-19: 150 mg via INTRAVENOUS
  Filled 2014-11-19: qty 83.34

## 2014-11-19 MED ORDER — FUROSEMIDE 10 MG/ML IJ SOLN
40.0000 mg | Freq: Three times a day (TID) | INTRAMUSCULAR | Status: AC
  Administered 2014-11-19 – 2014-11-20 (×2): 40 mg via INTRAVENOUS
  Filled 2014-11-19 (×2): qty 4

## 2014-11-19 MED ORDER — NOREPINEPHRINE BITARTRATE 1 MG/ML IV SOLN
0.0000 ug/min | INTRAVENOUS | Status: DC
  Administered 2014-11-19: 5 ug/min via INTRAVENOUS
  Filled 2014-11-19 (×3): qty 16

## 2014-11-19 MED ORDER — DEXTROSE 5 % IV SOLN
30.0000 ug/min | INTRAVENOUS | Status: DC
  Administered 2014-11-19: 30 ug/min via INTRAVENOUS
  Filled 2014-11-19: qty 1

## 2014-11-19 MED ORDER — VITAL HIGH PROTEIN PO LIQD
1000.0000 mL | ORAL | Status: DC
Start: 2014-11-19 — End: 2014-11-20
  Administered 2014-11-19: 1000 mL
  Filled 2014-11-19 (×3): qty 1000

## 2014-11-19 MED ORDER — THIAMINE HCL 100 MG/ML IJ SOLN
Freq: Once | INTRAVENOUS | Status: AC
  Administered 2014-11-19: 20:00:00 via INTRAVENOUS
  Filled 2014-11-19: qty 1000

## 2014-11-19 MED ORDER — PAMIDRONATE DISODIUM 30 MG/10ML IV SOLN
60.0000 mg | Freq: Once | INTRAVENOUS | Status: AC
  Administered 2014-11-19: 60 mg via INTRAVENOUS
  Filled 2014-11-19: qty 20

## 2014-11-19 MED ORDER — PHENYLEPHRINE HCL 10 MG/ML IJ SOLN
30.0000 ug/min | INTRAVENOUS | Status: DC
  Administered 2014-11-19 – 2014-11-20 (×4): 200 ug/min via INTRAVENOUS
  Administered 2014-11-20: 100 ug/min via INTRAVENOUS
  Administered 2014-11-20: 200 ug/min via INTRAVENOUS
  Administered 2014-11-21: 100 ug/min via INTRAVENOUS
  Filled 2014-11-19 (×8): qty 4

## 2014-11-19 MED ORDER — SODIUM CHLORIDE 0.9 % IV SOLN
25.0000 ug/h | INTRAVENOUS | Status: DC
  Administered 2014-11-19: 50 ug/h via INTRAVENOUS
  Administered 2014-11-20: 100 ug/h via INTRAVENOUS
  Filled 2014-11-19 (×2): qty 50

## 2014-11-19 MED ORDER — VANCOMYCIN HCL IN DEXTROSE 1-5 GM/200ML-% IV SOLN
1000.0000 mg | INTRAVENOUS | Status: DC
  Administered 2014-11-19 – 2014-11-20 (×2): 1000 mg via INTRAVENOUS
  Filled 2014-11-19 (×3): qty 200

## 2014-11-19 MED ORDER — ASPIRIN 81 MG PO CHEW
81.0000 mg | CHEWABLE_TABLET | Freq: Every day | ORAL | Status: DC
  Administered 2014-11-19 – 2014-11-20 (×2): 81 mg
  Filled 2014-11-19 (×2): qty 1

## 2014-11-19 MED ORDER — AMIODARONE HCL IN DEXTROSE 360-4.14 MG/200ML-% IV SOLN
30.0000 mg/h | INTRAVENOUS | Status: DC
  Administered 2014-11-20 – 2014-11-21 (×3): 30 mg/h via INTRAVENOUS
  Filled 2014-11-19 (×8): qty 200

## 2014-11-19 MED ORDER — MIDAZOLAM HCL 5 MG/ML IJ SOLN
1.0000 mg/h | INTRAMUSCULAR | Status: DC
  Administered 2014-11-19: 4 mg/h via INTRAVENOUS
  Administered 2014-11-20: 1 mg/h via INTRAVENOUS
  Filled 2014-11-19 (×3): qty 10

## 2014-11-19 MED ORDER — SODIUM BICARBONATE 8.4 % IV SOLN
INTRAVENOUS | Status: AC
  Filled 2014-11-19: qty 100

## 2014-11-19 MED ORDER — SODIUM BICARBONATE 8.4 % IV SOLN
100.0000 meq | Freq: Once | INTRAVENOUS | Status: AC
  Administered 2014-11-19: 100 meq via INTRAVENOUS
  Filled 2014-11-19: qty 100

## 2014-11-19 MED ORDER — HYDROCORTISONE NA SUCCINATE PF 100 MG IJ SOLR
50.0000 mg | Freq: Four times a day (QID) | INTRAMUSCULAR | Status: DC
  Administered 2014-11-19 – 2014-11-21 (×8): 50 mg via INTRAVENOUS
  Filled 2014-11-19: qty 1
  Filled 2014-11-19 (×2): qty 2
  Filled 2014-11-19 (×3): qty 1
  Filled 2014-11-19: qty 2
  Filled 2014-11-19 (×4): qty 1
  Filled 2014-11-19: qty 2

## 2014-11-19 MED ORDER — LACTULOSE 10 GM/15ML PO SOLN
20.0000 g | Freq: Three times a day (TID) | ORAL | Status: DC
  Administered 2014-11-19 – 2014-11-21 (×7): 20 g via ORAL
  Filled 2014-11-19 (×10): qty 30

## 2014-11-19 NOTE — Procedures (Signed)
ELECTROENCEPHALOGRAM REPORT  Date of Study: 11/19/2014  Patient's Name: Jeffrey Wolf MRN: [id number] Date of Birth: 03-Sep-1941  Referring Provider: Dr. Houston SirenPeter Le  Clinical History: This is a 74 year old man with a history of seizures, found unresponsive with sepsis secondary to pneumonia, intubated for impending respiratory failure.   Medications: Versed aspirin EC tablet 81 mg folic acid (FOLVITE) tablet 1 mg lactulose (CHRONULAC) 10 GM/15ML solution 20 g pantoprazole (PROTONIX) injection 40 mg piperacillin-tazobactam (ZOSYN) IVPB 3.375 g thiamine (B-1) injection 100 mg vancomycin (VANCOCIN) IVPB 1000 mg/200 mL premix  Technical Summary: A multichannel digital EEG recording measured by the international 10-20 system with electrodes applied with paste and impedances below 5000 ohms performed as portable with EKG monitoring in an intubated and sedated patient.  Hyperventilation and photic stimulation were not performed.  The digital EEG was referentially recorded, reformatted, and digitally filtered in a variety of bipolar and referential montages for optimal display.   Description: The patient is intubated and sedated during the recording.  There is no clear posterior dominant rhythm. The background consists of diffuse suppression with low voltage delta slowing seen at 3 uV/mm. There is no spontaneous variability or reactivity to noxious stimulation seen. There were no epileptiform discharges or electrographic seizures seen.    EKG lead was unremarkable.  Impression: This sedated EEG is abnormal due to severe diffuse slowing and suppression of the background.  Clinical Correlation of the above findings indicates diffuse cerebral dysfunction that is non-specific in etiology and can be seen with hypoxic/ischemic injury, toxic/metabolic encephalopathies, or medication effect from Versed. There were no electrographic seizures seen in this study.    Patrcia DollyKaren Solan Vosler, M.D.

## 2014-11-19 NOTE — Progress Notes (Addendum)
Pt HR 160-170s.  Dr. Kendrick FriesMcQuaid and Dr. Eugenia Pancoastrimble aware.  Dr. Eugenia Pancoastrimble @ bedside.  Pt cardioverted on 100 joules with no change in HR.  Pt then cardioverted on 150 joules with no change in HR.  Will monitor pt.

## 2014-11-19 NOTE — Progress Notes (Signed)
ABG results called to Dr. Kendrick FriesMcQuaid.  No vent change orders given at this time.  Sedation orders given to RN and will reassess.

## 2014-11-19 NOTE — Consult Note (Signed)
Consult requested by: [facility] Consult requested for respiratory failure:  HPI: This is a 74 year old is intubated and on mechanical ventilation. The history is obtained from the medical record. There is no family with him. Apparently he was found down in his house. His neighbors had not seen him for an unknown period of time and asked for EMS to check on him and when they did they found him on the floor covered with urine and feces. He initially was responsive enough to talk a little bit and seemed somewhat confused but he became progressively more short of breath culminating in intubation and mechanical ventilation. He was hypothermic. He has elevated LFTs and elevated BUN and creatinine and poor urine output.  Past Medical History  Diagnosis Date  . Hypertension   . Seizures   . Chronic back pain      History reviewed. No pertinent family history.   History   Social History  . Marital Status: Married    Spouse Name: N/A  . Number of Children: N/A  . Years of Education: N/A   Social History Main Topics  . Smoking status: Former Games developer  . Smokeless tobacco: Not on file  . Alcohol Use: No  . Drug Use: No  . Sexual Activity: Not on file   Other Topics Concern  . None   Social History Narrative     ROS: Not obtainable    Objective: Vital signs in last 24 hours: Temp:  [94.1 F (34.5 C)-99.7 F (37.6 C)] 99.1 F (37.3 C) (03/18 0541) Pulse Rate:  [103-126] 113 (03/18 0600) Resp:  [24-36] 36 (03/18 0600) BP: (87-127)/(44-71) 96/55 mmHg (03/18 0600) SpO2:  [92 %-100 %] 93 % (03/18 0600) FiO2 (%):  [70 %-100 %] 70 % (03/18 0714) Weight:  [78.8 kg (173 lb 11.6 oz)-80.1 kg (176 lb 9.4 oz)] 80.1 kg (176 lb 9.4 oz) (03/18 0541) Weight change:     Intake/Output from previous day: 03/17 0701 - 03/18 0700 In: 3940.1 [I.V.:3920.1] Out: 325 [Urine:325]  PHYSICAL EXAM He is intubated and sedated. Blood pressure is marginal in the 100 range. Pupils react. His  mucous membranes are dry. His neck is supple. He has diffuse rhonchi and wheezing on his chest examination. His heart is tachycardic without gallop. His abdomen is soft his liver is markedly enlarged. Extremities showed no edema. Central nervous system examination not really able to be evaluated  Lab Results: Basic Metabolic Panel:  Recent Labs  16/10/96 1842 11/30/2014 2105 11/19/14 0501  NA 142  --  143  K 4.7  --  4.0  CL 103  --  109  CO2 25  --  22  GLUCOSE 97  --  160*  BUN 66*  --  74*  CREATININE 2.12* 2.05* 2.24*  CALCIUM 13.6*  --  11.7*  MG  --  2.6*  --   PHOS  --  5.4*  --    Liver Function Tests:  Recent Labs  11/30/2014 1842 11/19/14 0501  AST 1425* 1230*  ALT 629* 573*  ALKPHOS 309* 233*  BILITOT 7.1* 5.9*  PROT 6.2 5.1*  ALBUMIN 3.0* 2.4*   No results for input(s): LIPASE, AMYLASE in the last 72 hours.  Recent Labs  Nov 30, 2014 2105  AMMONIA 140*   CBC:  Recent Labs  November 30, 2014 1842 11/30/14 2105 11/19/14 0501  WBC 10.4 7.3 8.1  NEUTROABS 8.0*  --   --   HGB 15.3 12.8* 13.0  HCT 44.9 38.9* 39.4  MCV 96.4 96.3  96.1  PLT 122* 114* 115*   Cardiac Enzymes:  Recent Labs  12-08-14 1842  TROPONINI 0.24*   BNP: No results for input(s): PROBNP in the last 72 hours. D-Dimer: No results for input(s): DDIMER in the last 72 hours. CBG: No results for input(s): GLUCAP in the last 72 hours. Hemoglobin A1C: No results for input(s): HGBA1C in the last 72 hours. Fasting Lipid Panel: No results for input(s): CHOL, HDL, LDLCALC, TRIG, CHOLHDL, LDLDIRECT in the last 72 hours. Thyroid Function Tests:  Recent Labs  December 08, 2014 2105  TSH 0.251*   Anemia Panel: No results for input(s): VITAMINB12, FOLATE, FERRITIN, TIBC, IRON, RETICCTPCT in the last 72 hours. Coagulation: No results for input(s): LABPROT, INR in the last 72 hours. Urine Drug Screen: Drugs of Abuse     Component Value Date/Time   LABOPIA NEGATIVE* 12/08/14 1805   COCAINSCRNUR  NEGATIVE* Dec 08, 2014 1805   LABBENZ NEGATIVE* 12-08-2014 1805   AMPHETMU NEGATIVE* 12/08/14 1805   THCU NEGATIVE* 12/08/14 1805   LABBARB NEGATIVE* 2014/12/08 1805    Alcohol Level:  Recent Labs  Dec 08, 2014 2105  ETH 7   Urinalysis:  Recent Labs  12-08-2014 1805  COLORURINE YELLOW  LABSPEC >1.030*  PHURINE 5.0  GLUCOSEU NEGATIVE  HGBUR MODERATE*  BILIRUBINUR MODERATE*  KETONESUR NEGATIVE  PROTEINUR NEGATIVE  UROBILINOGEN 0.2  NITRITE NEGATIVE  LEUKOCYTESUR NEGATIVE   Misc. Labs:   ABGS:  Recent Labs  11/19/14 0440  PHART 7.429  PO2ART 76.7*  TCO2 18.8  HCO3 21.2     MICROBIOLOGY: Recent Results (from the past 240 hour(s))  Blood culture (routine x 2)     Status: None (Preliminary result)   Collection Time: 12/08/14  8:55 PM  Result Value Ref Range Status   Specimen Description RIGHT ANTECUBITAL  Final   Special Requests BOTTLES DRAWN AEROBIC AND ANAEROBIC 6CC  Final   Culture PENDING  Incomplete   Report Status PENDING  Incomplete  Blood culture (routine x 2)     Status: None (Preliminary result)   Collection Time: 12-08-14  9:05 PM  Result Value Ref Range Status   Specimen Description BLOOD RIGHT HAND  Final   Special Requests BOTTLES DRAWN AEROBIC AND ANAEROBIC 6CC  Final   Culture PENDING  Incomplete   Report Status PENDING  Incomplete  MRSA PCR Screening     Status: None   Collection Time: 12/08/14 10:00 PM  Result Value Ref Range Status   MRSA by PCR NEGATIVE NEGATIVE Final    Comment:        The GeneXpert MRSA Assay (FDA approved for NASAL specimens only), is one component of a comprehensive MRSA colonization surveillance program. It is not intended to diagnose MRSA infection nor to guide or monitor treatment for MRSA infections.   Culture, expectorated sputum-assessment     Status: None   Collection Time: 11/19/14  3:00 AM  Result Value Ref Range Status   Specimen Description SPUTUM  Final   Special Requests NONE  Final   Sputum  evaluation   Final    THIS SPECIMEN IS ACCEPTABLE. RESPIRATORY CULTURE REPORT TO FOLLOW.   Report Status 11/19/2014 FINAL  Final    Studies/Results: Ct Abdomen Pelvis Wo Contrast  12/08/14   CLINICAL DATA:  Elevated liver function tests.  EXAM: CT ABDOMEN AND PELVIS WITHOUT CONTRAST  TECHNIQUE: Multidetector CT imaging of the abdomen and pelvis was performed following the standard protocol without IV contrast.  COMPARISON:  None.  FINDINGS: Multilevel degenerative disc disease is noted in the  lumbar spine. Mild right posterior basilar opacity is noted concerning for pneumonia or subsegmental atelectasis.  Liver measures 26 cm in diameter consistent with hepatomegaly. The spleen and pancreas appear normal. No definite gallstones or gallbladder inflammation is noted. Adrenal glands and kidneys appear normal. No hydronephrosis or renal obstruction is noted. Atherosclerotic calcifications of abdominal aorta are noted without aneurysm formation. There is no evidence of bowel obstruction. Sigmoid diverticulosis is noted without inflammation. Urinary bladder is decompressed secondary to Foley catheter. No abnormal fluid collection is noted. No significant adenopathy is noted.  IMPRESSION: Hepatomegaly is noted without focal abnormality seen. Right lower lobe pneumonia or atelectasis is noted. Sigmoid diverticulosis is noted without inflammation.   Electronically Signed   By: Lupita Raider, M.D.   On: 11/15/2014 20:16   Dg Abd 1 View  11/19/2014   CLINICAL DATA:  Nasogastric tube placement.  Initial encounter.  EXAM: ABDOMEN - 1 VIEW  COMPARISON:  CT of the abdomen and pelvis performed earlier today at 8:11 p.m.  FINDINGS: The patient's nasogastric tube is seen ending overlying the body of the stomach.  There is a slight paucity of visualized bowel gas. The visualized bowel gas pattern is grossly unremarkable. Right basilar airspace opacification is again noted. No acute osseous abnormalities are seen.   IMPRESSION: 1. Nasogastric tube noted ending overlying the body of the stomach. 2. Right basilar airspace opacification again noted.   Electronically Signed   By: Roanna Raider M.D.   On: 11/19/2014 02:05   Ct Head Wo Contrast  11/15/2014   CLINICAL DATA:  Altered mental status.  EXAM: CT HEAD WITHOUT CONTRAST  TECHNIQUE: Contiguous axial images were obtained from the base of the skull through the vertex without intravenous contrast.  COMPARISON:  None.  FINDINGS: Bony calvarium appears intact. Mild diffuse cortical atrophy is noted. Minimal chronic ischemic white matter disease is noted. No mass effect or midline shift is noted. Ventricular size is within normal limits. There is no evidence of mass lesion, hemorrhage or acute infarction.  IMPRESSION: Mild diffuse cortical atrophy. Minimal chronic ischemic white matter disease. No acute intracranial abnormality seen.   Electronically Signed   By: Lupita Raider, M.D.   On: 11/20/2014 19:26   Dg Chest Port 1 View  12/01/2014   CLINICAL DATA:  Hypothermia, altered mental status  EXAM: PORTABLE CHEST - 1 VIEW  COMPARISON:  None.  FINDINGS: Bilateral interstitial and alveolar airspace opacities. No pleural effusion or pneumothorax. Normal cardiomediastinal silhouette. 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  IMPRESSION: Bilateral interstitial and alveolar airspace opacities which may reflect mild pulmonary edema versus infection.   Electronically Signed   By: Elige Ko   On: 11/30/2014 18:49   Dg Chest Port 1v Same Day  11/19/2014   CLINICAL DATA:  Endotracheal tube placement. Found down on floor. Subsequent encounter.  EXAM: PORTABLE CHEST - 1 VIEW SAME DAY  COMPARISON:  Chest radiograph performed earlier today at 6:32 p.m.  FINDINGS: The patient's endotracheal tube is seen ending 3 cm above the carina.  Worsening small bilateral pleural effusions are seen, with worsening bibasilar airspace opacity, likely reflecting pulmonary edema.  Pneumonia could have a similar appearance. No pneumothorax is seen.  The cardiomediastinal silhouette is mildly enlarged. No acute osseous abnormalities are identified.  IMPRESSION: 1. Endotracheal tube seen ending 3 cm above the carina. 2. Worsening small bilateral pleural effusions, with worsening bibasilar airspace opacity, likely reflecting pulmonary edema. Pneumonia could have a similar appearance. 3. Mild cardiomegaly.  Electronically Signed   By: Roanna RaiderJeffery  Chang M.D.   On: 11/19/2014 00:28   [place] Abdomen Limited Ruq  11/19/2014   CLINICAL DATA:  Elevated liver enzymes  EXAM: US ABDOMEN LIMITED - RIGHT UPPER QUADRANT  COMPARISON:  CT Aug 01, 2015  FINDINGS: Gallbladder:  Sludge and multiple small calculi are present within the lumen. There is an 11 mm calculus in the gallbladder neck. Gallbladder wall thickness is 2.7 mm. The patient was not tender over the gallbladder during the examination.  Common bile duct:  Diameter: 4.9 mm, normal  Liver:  Abnormal liver, enlarged with diffusely coarsened and nodular echotexture. Craniocaudal measurement is 24 cm.  A small volume free fluid is present in the perihepatic spaces. There also appears to be a small right pleural effusion.  IMPRESSION: 1. 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 Cholelithiasis   Electronically Signed   By: Ellery Plunkaniel R Mitchell M.D.   On: 11/19/2014 00:06    Medications:  Prior to Admission:  Prescriptions prior to admission  Medication Sig Dispense Refill Last Dose  . aspirin EC 81 MG tablet Take 81 mg by mouth every morning.   Unknown at Unknown time  . Calcium Carb-Cholecalciferol (CALCIUM 600/VITAMIN D3) 600-800 MG-UNIT TABS Take 1 tablet by mouth 2 (two) times daily.   Unknown at Unknown time  . cyclobenzaprine (FLEXERIL) 5 MG tablet Take 5 mg by mouth 2 (two) times daily.   Unknown at Unknown time  . lisinopril (PRINIVIL,ZESTRIL) 10 MG tablet Take 10 mg by mouth every morning.   Unknown at Unknown time  . traMADol  (ULTRAM) 50 MG tablet Take 50 mg by mouth every 12 (twelve) hours as needed for pain.   Unknown at Unknown time  . valproic acid (DEPAKENE) 250 MG capsule Take 500 mg by mouth 2 (two) times daily.   Unknown at Unknown time  . vitamin B-12 (CYANOCOBALAMIN) 1000 MCG tablet Take 1,000 mcg by mouth every morning.   Unknown at Unknown time   Scheduled: . antiseptic oral rinse  7 mL Mouth Rinse QID  . aspirin EC  81 mg Oral q morning - 10a  . calcitonin  50 Units Intramuscular Once  . chlorhexidine  15 mL Mouth Rinse BID  . folic acid  1 mg Oral Daily  . heparin  5,000 Units Subcutaneous 3 times per day  . lactulose  20 g Oral 3 times per day  . lidocaine (cardiac) 100 mg/245ml      . multivitamin with minerals  1 tablet Oral Daily  . pantoprazole (PROTONIX) IV  40 mg Intravenous Q12H  . piperacillin-tazobactam  3.375 g Intravenous Q8H  . rocuronium      . sodium chloride  3 mL Intravenous Q12H  . thiamine  100 mg Oral Daily   Or  . thiamine  100 mg Intravenous Daily   Continuous: . dextrose 5 % and 0.9% NaCl 125 mL/hr at 11/19/14 0400  . midazolam (VERSED) infusion     WUX:[id number]:albuterol, fentaNYL, fentaNYL, LORazepam **OR** LORazepam, midazolam, midazolam, ondansetron **OR** ondansetron (ZOFRAN) IV  Assesment: He is admitted with respiratory failure. I think he probably aspirated. His liver looks like it is cirrhotic on CT and ultrasound and it is clearly enlarged. He has elevated LFTs. He has acute kidney injury. He's been hypothermic. His troponin level is elevated. He is requiring 70% oxygen and 10 of PEEP. Chest x-ray is pending today. He is septic and has multi-system failure. Principal Problem:   Sepsis Active Problems:   History of seizure  HTN (hypertension)   Alcohol use   Community acquired pneumonia   Elevated liver function tests   Hypothermia   AKI (acute kidney injury)   Elevated troponin    Plan: I discussed with Dr. Irene Limbo. I think he needs to be transferred to a  higher level of care. I will not be here the 19th or 20th and will return on the 21st    LOS: 1 day   Diella Gillingham L 11/19/2014, 7:36 AM

## 2014-11-19 NOTE — Progress Notes (Signed)
Dr Conley RollsLe up to see pt  Orders completed Consult to Pulmonary
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
## 2014-11-19 NOTE — Care Management Note (Signed)
    Page 1 of 1   11/19/2014     1:06:20 PM CARE MANAGEMENT NOTE 11/19/2014  Patient:  Jeffrey Wolf,Jeffrey Wolf   Account Number:  [id number]  Date Initiated:  11/19/2014  Documentation initiated by:  Junius CreamerWELL,DEBBIE  Subjective/Objective Assessment:   adm w sepsis     Action/Plan:   lives alone   Anticipated DC Date:     Anticipated DC Plan:  SKILLED NURSING FACILITY  In-house referral  Clinical Social Worker         Choice offered to / List presented to:             Status of service:   Medicare Important Message given?   (If response is "NO", the following Medicare IM given date fields will be blank) Date Medicare IM given:   Medicare IM given by:   Date Additional Medicare IM given:   Additional Medicare IM given by:    Discharge Disposition:    Per UR Regulation:  Reviewed for med. necessity/level of care/duration of stay  If discussed at Long Length of Stay Meetings, dates discussed:    Comments:

## 2014-11-19 NOTE — Progress Notes (Signed)
ANTIBIOTIC CONSULT NOTE   Pharmacy Consult for Vancomycin  Indication: pneumonia, sepsis  No Known Allergies  Patient Measurements: Height: 6' (182.9 cm) Weight: 176 lb 9.4 oz (80.1 kg) IBW/kg (Calculated) : 77.6  Vital Signs: Temp: 99.1 F (37.3 C) (03/18 0541) Temp Source: Core (Comment) (03/18 0541) BP: 95/56 mmHg (03/18 0700) Pulse Rate: 115 (03/18 0700) Intake/Output from previous day: 03/17 0701 - 03/18 0700 In: 4333.1 [I.V.:4313.1] Out: 325 [Urine:325] Intake/Output from this shift:    Labs:  Recent Labs  11/07/2014 1842 11/06/2014 2105 11/19/14 0501  WBC 10.4 7.3 8.1  HGB 15.3 12.8* 13.0  PLT 122* 114* 115*  CREATININE 2.12* 2.05* 2.24*   Estimated Creatinine Clearance: 31.8 mL/min (by C-G formula based on Cr of 2.24). No results for input(s): VANCOTROUGH, VANCOPEAK, VANCORANDOM, GENTTROUGH, GENTPEAK, GENTRANDOM, TOBRATROUGH, TOBRAPEAK, TOBRARND, AMIKACINPEAK, AMIKACINTROU, AMIKACIN in the last 72 hours.   Microbiology: Recent Results (from the past 720 hour(s))  Blood culture (routine x 2)     Status: None (Preliminary result)   Collection Time: 11/07/2014  8:55 PM  Result Value Ref Range Status   Specimen Description RIGHT ANTECUBITAL  Final   Special Requests BOTTLES DRAWN AEROBIC AND ANAEROBIC 6CC  Final   Culture NO GROWTH 1 DAY  Final   Report Status PENDING  Incomplete  Blood culture (routine x 2)     Status: None (Preliminary result)   Collection Time: 11/28/2014  9:05 PM  Result Value Ref Range Status   Specimen Description BLOOD RIGHT HAND  Final   Special Requests BOTTLES DRAWN AEROBIC AND ANAEROBIC 6CC  Final   Culture NO GROWTH 1 DAY  Final   Report Status PENDING  Incomplete  MRSA PCR Screening     Status: None   Collection Time: 11/03/2014 10:00 PM  Result Value Ref Range Status   MRSA by PCR NEGATIVE NEGATIVE Final    Comment:        The GeneXpert MRSA Assay (FDA approved for NASAL specimens only), is one component of a comprehensive MRSA  colonization surveillance program. It is not intended to diagnose MRSA infection nor to guide or monitor treatment for MRSA infections.   Culture, expectorated sputum-assessment     Status: None   Collection Time: 11/19/14  3:00 AM  Result Value Ref Range Status   Specimen Description SPUTUM  Final   Special Requests NONE  Final   Sputum evaluation   Final    THIS SPECIMEN IS ACCEPTABLE. RESPIRATORY CULTURE REPORT TO FOLLOW.   Report Status 11/19/2014 FINAL  Final    Anti-infectives    Start     Dose/Rate Route Frequency Ordered Stop   11/19/14 2359  vancomycin (VANCOCIN) IVPB 1000 mg/200 mL premix     1,000 mg 200 mL/hr over 60 Minutes Intravenous Every 24 hours 11/19/14 0904     11/19/14 0000  piperacillin-tazobactam (ZOSYN) IVPB 3.375 g     3.375 g 12.5 mL/hr over 240 Minutes Intravenous Every 8 hours 11/12/2014 2250     11/30/2014 2330  vancomycin (VANCOCIN) IVPB 1000 mg/200 mL premix     1,000 mg 200 mL/hr over 60 Minutes Intravenous  Once 11/07/2014 2321 11/19/14 0128   11/29/2014 1915  cefTRIAXone (ROCEPHIN) 1 g in dextrose 5 % 50 mL IVPB     1 g 100 mL/hr over 30 Minutes Intravenous  Once 11/16/2014 1908 11/06/2014 2023   11/15/2014 1915  azithromycin (ZITHROMAX) 500 mg in dextrose 5 % 250 mL IVPB     500 mg 250 mL/hr  over 60 Minutes Intravenous  Once 11/06/2014 1908 11/14/2014 2049      Assessment: 74 yo M found unresponsive at home.  He was hypothermic on admission.   CXR + PNA.  Thick greenish-yellow sputum noted.  Patient was intubated overnight.  Concern for aspiration so CAP coverage is being broadened to Vanc + Zosyn for anaerobic coverage.  Acute renal failure noted. CrCl ~ 1730ml/min.     Rocephin 3/17>>3/17 Zithromax 3/17>>3/17 Zosyn 3/17>> Vancomycin 3/17>>  Goal of Therapy:  Vancomycin trough level 15-20 mcg/ml  Eradicate infection.  Plan:  Continue Zosyn 3.375gm IV Q8h to be infused over 4hrs Vancomycin 1gm IV q24h Check Vancomycin trough at steady  state Monitor renal function and cx data  Duration of therapy per MD Consider add Zithromax for atypical coverage   Elson ClanLilliston, Kelina Beauchamp Michelle 11/19/2014,9:07 AM

## 2014-11-19 NOTE — Progress Notes (Signed)
[facility] Patient left with [facility]. Hazle QuantEllen Shaw aware of transfer.

## 2014-11-19 NOTE — Progress Notes (Signed)
EEG Completed; Results Pending  

## 2014-11-19 NOTE — Progress Notes (Signed)
INITIAL NUTRITION ASSESSMENT  DOCUMENTATION CODES Per approved criteria  -Not Applicable   INTERVENTION:  If unable to extubate patient within the next 24 hours, recommend initiate TF via OGT with Vital AF 1.2 at 25 ml/h and Prostat 30 ml BID on day 1; on day 2, d/c Prostat and increase to goal rate of 70 ml/h (1680 ml per day) to provide 2016 kcals, 126 gm protein, 1362 ml free water daily.  NUTRITION DIAGNOSIS: Inadequate oral intake related to inability to eat as evidenced by NPO status.   Goal: Intake to meet >90% of estimated nutrition needs.  Monitor:  TF initiation/tolerance/adequacy, weight trend, labs, vent status.  Reason for Assessment: VDRF  74 y.o. male  Admitting Dx: Sepsis  ASSESSMENT: Patient with history of seizure disorder; presented to [facility] after being found down at home for an unknown period of time. [facility]  Nutrition Focused Physical Exam:  Subcutaneous Fat:  Orbital Region: N/A Upper Arm Region: WNL Thoracic and Lumbar Region: N/A  Muscle:  Temple Region: mild depletion Clavicle Bone Region: mild depletion Clavicle and Acromion Bone Region: mild depletion Scapular Bone Region: N/A Dorsal Hand: WNL Patellar Region: WNL Anterior Thigh Region: WNL Posterior Calf Region: mild depletion  Edema: none   Patient is currently intubated on ventilator support MV: 14.1 L/min Temp (24hrs), Avg:98.6 F (37 C), Min:94.1 F (34.5 C), Max:99.9 F (37.7 C)  Propofol: none   Height: Ht Readings from Last 1 Encounters:  11/19/14 6' (1.829 m)    Weight: Wt Readings from Last 1 Encounters:  11/19/14 172 lb 6.4 oz (78.2 kg)    Ideal Body Weight: 80.9 kg  % Ideal Body Weight: 97%  Wt Readings from Last 10 Encounters:  11/19/14 172 lb 6.4 oz (78.2 kg)  11/18/12 185 lb (83.915 kg)    Usual Body Weight: 185 lbs (2 years ago)  % Usual Body Weight: 93%  BMI:  Body mass index is 23.38 kg/(m^2).  Estimated  Nutritional Needs: Kcal: 2024 Protein: 110-125 gm Fluid: >/= 2 L  Skin: stage 2 pressure ulcer to sacrum  Diet Order: Diet NPO time specified  EDUCATION NEEDS: -Education not appropriate at this time   Intake/Output Summary (Last 24 hours) at 11/19/14 1431 Last data filed at 11/19/14 1300  Gross per 24 hour  Intake 5083.98 ml  Output    450 ml  Net 4633.98 ml    Last BM: unknown   Labs:   Recent Labs Lab 2015/08/21 1842 2015/08/21 2105 11/19/14 0501  NA 142  --  143  K 4.7  --  4.0  CL 103  --  109  CO2 25  --  22  BUN 66*  --  74*  CREATININE 2.12* 2.05* 2.24*  CALCIUM 13.6*  --  11.7*  MG  --  2.6*  --   PHOS  --  5.4*  --   GLUCOSE 97  --  160*    CBG (last 3)   Recent Labs  11/19/14 0859 11/19/14 1223  GLUCAP 129* 120*    Scheduled Meds: . antiseptic oral rinse  7 mL Mouth Rinse QID  . aspirin EC  81 mg Oral q morning - 10a  . chlorhexidine  15 mL Mouth Rinse BID  . folic acid  1 mg Oral Daily  . heparin  5,000 Units Subcutaneous 3 times per day  . lactulose  20 g Oral 3 times per day  . multivitamin with minerals  1 tablet  Oral Daily  . pantoprazole (PROTONIX) IV  40 mg Intravenous Q12H  . piperacillin-tazobactam  3.375 g Intravenous Q8H  . sodium chloride  3 mL Intravenous Q12H  . thiamine  100 mg Oral Daily   Or  . thiamine  100 mg Intravenous Daily  . vancomycin  1,000 mg Intravenous Q24H    Continuous Infusions: . dextrose 5 % and 0.9% NaCl 125 mL/hr at 11/19/14 0946  . midazolam (VERSED) infusion 2 mg/hr (11/19/14 1233)    Past Medical History  Diagnosis Date  . Hypertension   . Seizures   . Chronic back pain     History reviewed. No pertinent past surgical history.   Joaquin Courts, RD, LDN, CNSC Pager [phone] After Hours Pager [phone]

## 2014-11-19 NOTE — Procedures (Signed)
Central Venous Catheter Insertion Procedure Note Jeffrey CoombsWilliam Wolf [id number] Jan 31, 1941  Procedure: Insertion of Central Venous Catheter Indications: Assessment of intravascular volume  Procedure Details Consent: Risks of procedure as well as the alternatives and risks of each were explained to the (patient/caregiver).  Consent for procedure obtained. Time Out: Verified patient identification, verified procedure, site/side was marked, verified correct patient position, special equipment/implants available, medications/allergies/relevent history reviewed, required imaging and test results available.  Performed  Maximum sterile technique was used including antiseptics, cap, gloves, gown, hand hygiene, mask and sheet. Skin prep: Chlorhexidine; local anesthetic administered A antimicrobial bonded/coated triple lumen catheter was placed in the right internal jugular vein using the Seldinger technique.  Evaluation Blood flow good Complications: No apparent complications Patient did tolerate procedure well. Chest X-ray ordered to verify placement.  CXR: normal.  Jeffrey Wolf 11/19/2014, 9:38 PM

## 2014-11-19 NOTE — Progress Notes (Signed)
Peep increased to 10 per Dr. Conley RollsLe

## 2014-11-19 NOTE — Procedures (Signed)
Arterial Catheter Insertion Procedure Note Teresa CoombsWilliam Binney [id number] 08-29-1941  Procedure: Insertion of Arterial Catheter  Indications: Blood pressure monitoring and Frequent blood sampling  Procedure Details Consent: Risks of procedure as well as the alternatives and risks of each were explained to the (patient/caregiver).  Consent for procedure obtained. Time Out: Verified patient identification, verified procedure, site/side was marked, verified correct patient position, special equipment/implants available, medications/allergies/relevent history reviewed, required imaging and test results available.  Performed  Maximum sterile technique was used including antiseptics, gloves, hand hygiene and mask. Skin prep: Chlorhexidine; local anesthetic administered 22 gauge catheter was inserted into left radial artery using the Seldinger technique.  Evaluation Blood flow good; BP tracing good. Complications: No apparent complications.   Nanna Ertle, Clifton CustardARON 11/19/2014

## 2014-11-19 NOTE — Progress Notes (Signed)
eLink Physician-Brief Progress Note Patient Name: Jeffrey CoombsWilliam Wolf DOB: 07-04-1941 MRN: [id number]   Date of Service  11/19/2014  HPI/Events of Note  Afib with RVR, hypotensive  eICU Interventions  Cardioversion now     Intervention Category Major Interventions: Arrhythmia - evaluation and management  [name] 11/19/2014, 8:21 PM

## 2014-11-19 NOTE — Progress Notes (Signed)
eLink Physician-Brief Progress Note Patient Name: Jeffrey CoombsWilliam Wolf DOB: [birth year] MRN: [id number]   Date of Service  11/19/2014  HPI/Events of Note  Patient with mixed resp and metabolic acidosis breathing 37 on vent with TV of 450 cc.  HR is elevated at 152 with BP of 74/50 (60).    eICU Interventions  Plan: Change to ARDS protocol for now.  Recheck ABG in 1 to 2 hours post vent change with increase in RR on vent to 35. 2 amps of Na Bicarb for acidosis Start Vasopressin for BP support Attempt to reduce peep as tolerated secondary to hypotension     Intervention Category Major Interventions: Acid-Base disturbance - evaluation and management;Shock - evaluation and management  Akeen Ledyard 11/19/2014, 10:07 PM

## 2014-11-19 NOTE — Progress Notes (Signed)
Pt arrived to ICU in obvious respiratory distress/pending failure. Resp rate 45-55 O2 sats in low to mid 80s Accessory muscle use Pt lethargic and [place] of  Approximately 6/7 Pt was put on 100%NRB and Phys called immediately  Pt was NT suctioned and orally suctioned for copious thick yellow/Green sputum  Physician arrived and agreed with staff on ETI Pt intubated by Dr Bebe ShaggyWickline 1 attempt with success, O2 sats were improved and maintained throughout and prior by BVM. PT sats immediately to 97% ABG and orders pending  Pt is NOT restrained and is sedated and ventilated CXR conformation of ETT and OGT

## 2014-11-19 NOTE — Progress Notes (Addendum)
PROGRESS NOTE  Jeffrey Ferrall ZOX:[id number] DOB: 1941-03-25 DOA: Dec 06, 2014 PCP: Renaldo Harrison, DO  Summary: 74 year old man with history of seizure disorder presented after being found down at home beside of his bed, soiled and wet with feces and urine for unknown period of time. He was admitted for sepsis secondary to pneumonia, hypercalcemia, elevated LFTs, positive troponin. Within the next several hours he developed increased work of breathing and hypoxia and was intubated for impending respiratory failure. He required increased PEEP and high FiO2. Pulmonology evaluated the patient and recommended transfer to higher level of care for sepsis with multiorgan dysfunction.  Assessment/Plan: 1. Severe sepsis with hypothermia on admission, circulatory shock improved with volume, acute kidney injury, ventilatory dependent respiratory failure (hypoxemia), thrombocytopenia, suspected developing ARDS. 2. CAP vs aspiration. 3. Elevated LFTs. Consider shock liver, alcoholic hepatitis. 4. Hypercalcemia, improved with IV fluids. Likely reflects volume depletion and acute illness. 5. Seizure disorder. No evidence of seizure activity. EEG planned. Reportedly off seizure medication for a long time.  6. Alcohol use, question dependence versus abuse. 7. Positive troponin. Suspect dependent ischemia.  8. 2 stage 1-2 pressure ulcers above the sacrum present on admission   Patient is critically ill with sepsis, pneumonia, high FiO2 requirement and MODS. Discussed with Dr. Juanetta Gosling, plan transfer to [facility] for higher level of care.  Unable to reach wife at number provided [phone]  Discussed with Dr. Tyson Alias PCCM, accepted to his service ICU at [facility].  In meantime continue IVF, abx, vent management per pulmonology.  Check flu. Place on droplet precautions  EEG  Labs in AM  Code Status: full code DVT prophylaxis: heparin Family Communication: none Disposition Plan: [facility]  Brendia Sacks, MD  [facility]  Pager [phone] If 7PM-7AM, please contact night-coverage at [email or web address], password [facility] 11/19/2014, 8:04 AM  LOS: 1 day   Consultants:  Pulmonology  Procedures:  ETT 3/17 >>  Antibiotics:  Azithromycin 3/17  Zosyn 3/17 >>  Vancomycin 3/17 >>  Ceftriaxone 3/17  HPI/Subjective: Intubated and sedated.  Oliguric per RN. Not on vasopressors. SBP >100.  Objective: Filed Vitals:   11/19/14 0500 11/19/14 0541 11/19/14 0600 11/19/14 0700  BP: 88/55  96/55 95/56  Pulse: 114  113 115  Temp:  99.1 F (37.3 C)    TempSrc:  Core (Comment)    Resp: 36  36 35  Height:      Weight:  80.1 kg (176 lb 9.4 oz)    SpO2: 92%  93% 93%    Intake/Output Summary (Last 24 hours) at 11/19/14 0804 Last data filed at 11/19/14 0700  Gross per 24 hour  Intake 4333.08 ml  Output    325 ml  Net 4008.08 ml     Filed Weights   12-06-14 2300 11/19/14 0541  Weight: 78.8 kg (173 lb 11.6 oz) 80.1 kg (176 lb 9.4 oz)    Exam:     Hypotensive, tachypneic, tachycardic. FiO2 70%. General: Intubated, sedated. Appears critically ill. Respiratory rate 40s. Eyes: Pupils, irises, was appear grossly unremarkable ENT: Lips appear unremarkable grossly Cardiovascular: Tachycardic, regular rhythm, no m/r/g. No LE edema. Telemetry: Sinus tachycardia  Respiratory: CTA bilaterally, no w/r/r. Decreased breath sounds. Increased respiratory effort. Increased respiratory effor.t Abdomen: soft, ntnd Skin: no rash or induration noted Musculoskeletal: grossly unremarkable, but limited on exam Psychiatric: cannot assess Neurologic: cannot assess  Data Reviewed:  Urine output 325   ABG 7.429/32/76  Creatinine 2.12 >>2.24   Calcium 13.6 >>11.7   AST 1425 >>1230  ALT 629 >>573  Total bilirubin 7.1 >>5.9   Alkaline phosphatase 309 >>  Ammonia 140  Platelet count stable 115   Troponin 0.24 >>  Chest x-ray bilateral interstitial opacities >> worsening bilateral pleural  effusions, pneumonia  CT abdomen pelvis with hepatomegaly. Right lower lobe pneumonia.  Right upper quadrant ultrasound likely cirrhotic liver  EKG sinus tachycardia  Scheduled Meds: . antiseptic oral rinse  7 mL Mouth Rinse QID  . aspirin EC  81 mg Oral q morning - 10a  . calcitonin  50 Units Intramuscular Once  . chlorhexidine  15 mL Mouth Rinse BID  . folic acid  1 mg Oral Daily  . heparin  5,000 Units Subcutaneous 3 times per day  . lactulose  20 g Oral 3 times per day  . lidocaine (cardiac) 100 mg/745ml      . multivitamin with minerals  1 tablet Oral Daily  . pantoprazole (PROTONIX) IV  40 mg Intravenous Q12H  . piperacillin-tazobactam  3.375 g Intravenous Q8H  . rocuronium      . sodium chloride  3 mL Intravenous Q12H  . thiamine  100 mg Oral Daily   Or  . thiamine  100 mg Intravenous Daily   Continuous Infusions: . dextrose 5 % and 0.9% NaCl 125 mL/hr at 11/19/14 0700  . midazolam (VERSED) infusion Stopped (11/19/14 0500)    Principal Problem:   Sepsis Active Problems:   History of seizure   HTN (hypertension)   Alcohol use   Community acquired pneumonia   Elevated liver function tests   Hypothermia   AKI (acute kidney injury)   Elevated troponin   Time spent 60 minutes, >50% in coordination of care.

## 2014-11-19 NOTE — Progress Notes (Signed)
  Amiodarone Drug - Drug Interaction Consult Note  Recommendations: No drug interactions noted - will continue to follow.  Amiodarone is metabolized by the cytochrome P450 system and therefore has the potential to cause many drug interactions. Amiodarone has an average plasma half-life of 50 days (range 20 to 100 days).   There is potential for drug interactions to occur several weeks or months after stopping treatment and the onset of drug interactions may be slow after initiating amiodarone.   []  Statins: Increased risk of myopathy. Simvastatin- restrict dose to 20mg  daily. Other statins: counsel patients to report any muscle pain or weakness immediately.  []  Anticoagulants: Amiodarone can increase anticoagulant effect. Consider warfarin dose reduction. Patients should be monitored closely and the dose of anticoagulant altered accordingly, remembering that amiodarone levels take several weeks to stabilize.  []  Antiepileptics: Amiodarone can increase plasma concentration of phenytoin, the dose should be reduced. Note that small changes in phenytoin dose can result in large changes in levels. Monitor patient and counsel on signs of toxicity.  []  Beta blockers: increased risk of bradycardia, AV block and myocardial depression. Sotalol - avoid concomitant use.  []   Calcium channel blockers (diltiazem and verapamil): increased risk of bradycardia, AV block and myocardial depression.  []   Cyclosporine: Amiodarone increases levels of cyclosporine. Reduced dose of cyclosporine is recommended.  []  Digoxin dose should be halved when amiodarone is started.  []  Diuretics: increased risk of cardiotoxicity if hypokalemia occurs.  []  Oral hypoglycemic agents (glyburide, glipizide, glimepiride): increased risk of hypoglycemia. Patient's glucose levels should be monitored closely when initiating amiodarone therapy.   []  Drugs that prolong the QT interval:  Torsades de pointes risk may be increased with  concurrent use - avoid if possible.  Monitor QTc, also keep magnesium/potassium WNL if concurrent therapy can't be avoided. Marland Kitchen. Antibiotics: e.g. fluoroquinolones, erythromycin. . Antiarrhythmics: e.g. quinidine, procainamide, disopyramide, sotalol. . Antipsychotics: e.g. phenothiazines, haloperidol.  . Lithium, tricyclic antidepressants, and methadone.  Thank You,  Loura BackJennifer Zayante, [address] Rainbow BoulevardPharm.D., BCPS Clinical Pharmacist Pager: [phone] 11/19/2014 8:24 PM

## 2014-11-20 ENCOUNTER — Inpatient Hospital Stay (HOSPITAL_COMMUNITY): Payer: Medicare Other

## 2014-11-20 DIAGNOSIS — I370 Nonrheumatic pulmonary valve stenosis: Secondary | ICD-10-CM

## 2014-11-20 DIAGNOSIS — N289 Disorder of kidney and ureter, unspecified: Secondary | ICD-10-CM

## 2014-11-20 DIAGNOSIS — J9601 Acute respiratory failure with hypoxia: Secondary | ICD-10-CM

## 2014-11-20 LAB — POCT I-STAT 3, ART BLOOD GAS (G3+)
ACID-BASE DEFICIT: 10 mmol/L — AB (ref 0.0–2.0)
Acid-base deficit: 9 mmol/L — ABNORMAL HIGH (ref 0.0–2.0)
Acid-base deficit: 6 mmol/L — ABNORMAL HIGH (ref 0.0–2.0)
BICARBONATE: 19.6 meq/L — AB (ref 20.0–24.0)
BICARBONATE: 14.8 meq/L — AB (ref 20.0–24.0)
Bicarbonate: 17.6 mEq/L — ABNORMAL LOW (ref 20.0–24.0)
O2 SAT: 94 %
O2 SAT: 93 %
O2 Saturation: 96 %
PCO2 ART: 40.6 mmHg (ref 35.0–45.0)
PCO2 ART: 28.5 mmHg — AB (ref 35.0–45.0)
PO2 ART: 84 mmHg (ref 80.0–100.0)
PO2 ART: 93 mmHg (ref 80.0–100.0)
Patient temperature: 98.6
Patient temperature: 98.6
TCO2: 19 mmol/L (ref 0–100)
TCO2: 21 mmol/L (ref 0–100)
TCO2: 16 mmol/L (ref 0–100)
pCO2 arterial: 36.9 mmHg (ref 35.0–45.0)
pH, Arterial: 7.246 — ABNORMAL LOW (ref 7.350–7.450)
pH, Arterial: 7.334 — ABNORMAL LOW (ref 7.350–7.450)
pH, Arterial: 7.329 — ABNORMAL LOW (ref 7.350–7.450)
pO2, Arterial: 72 mmHg — ABNORMAL LOW (ref 80.0–100.0)

## 2014-11-20 LAB — CBC
HEMATOCRIT: 41.1 % (ref 39.0–52.0)
Hemoglobin: 13.4 g/dL (ref 13.0–17.0)
MCH: 32.1 pg (ref 26.0–34.0)
MCHC: 32.6 g/dL (ref 30.0–36.0)
MCV: 98.3 fL (ref 78.0–100.0)
Platelets: 128 10*3/uL — ABNORMAL LOW (ref 150–400)
RBC: 4.18 MIL/uL — ABNORMAL LOW (ref 4.22–5.81)
RDW: 17.4 % — ABNORMAL HIGH (ref 11.5–15.5)
WBC: 11.7 10*3/uL — AB (ref 4.0–10.5)

## 2014-11-20 LAB — MAGNESIUM: MAGNESIUM: 2.5 mg/dL (ref 1.5–2.5)

## 2014-11-20 LAB — GLUCOSE, CAPILLARY
GLUCOSE-CAPILLARY: 143 mg/dL — AB (ref 70–99)
GLUCOSE-CAPILLARY: 122 mg/dL — AB (ref 70–99)
GLUCOSE-CAPILLARY: 130 mg/dL — AB (ref 70–99)
Glucose-Capillary: 134 mg/dL — ABNORMAL HIGH (ref 70–99)
Glucose-Capillary: 161 mg/dL — ABNORMAL HIGH (ref 70–99)

## 2014-11-20 LAB — COMPREHENSIVE METABOLIC PANEL
ALK PHOS: 236 U/L — AB (ref 39–117)
ALT: 1073 U/L — ABNORMAL HIGH (ref 0–53)
AST: 2803 U/L — ABNORMAL HIGH (ref 0–37)
Albumin: 2.2 g/dL — ABNORMAL LOW (ref 3.5–5.2)
Anion gap: 17 — ABNORMAL HIGH (ref 5–15)
BUN: 80 mg/dL — ABNORMAL HIGH (ref 6–23)
CO2: 20 mmol/L (ref 19–32)
Calcium: 10.8 mg/dL — ABNORMAL HIGH (ref 8.4–10.5)
Chloride: 107 mmol/L (ref 96–112)
Creatinine, Ser: 3.43 mg/dL — ABNORMAL HIGH (ref 0.50–1.35)
GFR calc Af Amer: 19 mL/min — ABNORMAL LOW (ref >90)
GFR calc non Af Amer: 16 mL/min — ABNORMAL LOW (ref >90)
Glucose, Bld: 151 mg/dL — ABNORMAL HIGH (ref 70–99)
POTASSIUM: 4.1 mmol/L (ref 3.5–5.1)
SODIUM: 144 mmol/L (ref 135–145)
Total Bilirubin: 8.6 mg/dL — ABNORMAL HIGH (ref 0.3–1.2)
Total Protein: 5 g/dL — ABNORMAL LOW (ref 6.0–8.3)

## 2014-11-20 LAB — HEPATITIS PANEL, ACUTE
HCV AB: NEGATIVE
HEP A IGM: NONREACTIVE
HEP B C IGM: NONREACTIVE
HEP B S AG: NEGATIVE

## 2014-11-20 LAB — CORTISOL: CORTISOL PLASMA: 91.3 ug/dL

## 2014-11-20 LAB — CK TOTAL AND CKMB (NOT AT ARMC)
CK TOTAL: 1680 U/L — AB (ref 7–232)
CK, MB: 35.8 ng/mL (ref 0.3–4.0)
Relative Index: 2.1 (ref 0.0–2.5)

## 2014-11-20 LAB — LACTIC ACID, PLASMA: Lactic Acid, Venous: 6.2 mmol/L (ref 0.5–2.0)

## 2014-11-20 LAB — T4, FREE: Free T4: 0.41 ng/dL — ABNORMAL LOW (ref 0.80–1.80)

## 2014-11-20 LAB — TROPONIN I: TROPONIN I: 2.55 ng/mL — AB (ref <0.031)

## 2014-11-20 LAB — CK: Total CK: 1184 U/L — ABNORMAL HIGH (ref 7–232)

## 2014-11-20 LAB — PHOSPHORUS: Phosphorus: 6.4 mg/dL — ABNORMAL HIGH (ref 2.3–4.6)

## 2014-11-20 LAB — VALPROIC ACID LEVEL: Valproic Acid Lvl: <10 ug/mL — ABNORMAL LOW (ref 50.0–100.0)

## 2014-11-20 MED ORDER — VALPROIC ACID 250 MG/5ML PO SYRP
500.0000 mg | ORAL_SOLUTION | Freq: Two times a day (BID) | ORAL | Status: DC
  Administered 2014-11-20 – 2014-11-21 (×3): 500 mg via ORAL
  Filled 2014-11-20 (×6): qty 10

## 2014-11-20 MED ORDER — SODIUM CHLORIDE 0.9 % IV SOLN
INTRAVENOUS | Status: DC
  Administered 2014-11-20: 03:00:00 via INTRAVENOUS

## 2014-11-20 MED ORDER — SODIUM BICARBONATE 8.4 % IV SOLN
INTRAVENOUS | Status: AC
  Filled 2014-11-20: qty 100

## 2014-11-20 MED ORDER — SODIUM BICARBONATE 8.4 % IV SOLN
INTRAVENOUS | Status: AC
  Administered 2014-11-20: 100 meq via INTRAVENOUS
  Filled 2014-11-20: qty 100

## 2014-11-20 MED ORDER — VITAL AF 1.2 CAL PO LIQD
1000.0000 mL | ORAL | Status: DC
  Administered 2014-11-20: 1000 mL
  Filled 2014-11-20 (×8): qty 1000

## 2014-11-20 MED ORDER — SODIUM CHLORIDE 0.9 % IV BOLUS (SEPSIS)
1000.0000 mL | Freq: Once | INTRAVENOUS | Status: AC
  Administered 2014-11-20: 1000 mL via INTRAVENOUS

## 2014-11-20 MED ORDER — SODIUM BICARBONATE 8.4 % IV SOLN
100.0000 meq | Freq: Once | INTRAVENOUS | Status: AC
  Administered 2014-11-20: 100 meq via INTRAVENOUS

## 2014-11-20 MED ORDER — SODIUM BICARBONATE 8.4 % IV SOLN
100.0000 meq | Freq: Once | INTRAVENOUS | Status: AC
  Administered 2014-11-20: 100 meq via INTRAVENOUS
  Filled 2014-11-20: qty 100

## 2014-11-20 NOTE — Progress Notes (Signed)
Spoke with Dr. Darrick Pennaeterding regarding pts BP.  Aline SBP-70s while cuff SBP-120s.  Dr. Darrick Pennaeterding would like pressors titrated to keep A-line MAP>60 as long at pt's urine output is adequate.  Will monitor pt.
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
## 2014-11-20 NOTE — Progress Notes (Signed)
CRITICAL VALUE ALERT  Critical value received:  Troponin 1.45  Date of notification:  11/20/14  Time of notification:  1728  Critical value read back:Yes.    Nurse who received alert:  Layne BentonJulian Deja Kaigler, RN  MD notified (1st page):  Dr. Arsenio LoaderSommer  Time of first page:  1734  MD notified (2nd page):  Time of second page:  Responding MD:  Dr. Arsenio LoaderSommer  Time MD responded:  [phone]

## 2014-11-20 NOTE — Progress Notes (Signed)
eLink Physician-Brief Progress Note Patient Name: Jeffrey CoombsWilliam Wolf DOB: Apr 11, 1941 MRN: [id number]   Date of Service  11/20/2014  HPI/Events of Note  Lactic Acid = 6.2 and CVP = 6. Troponin = 1.45. Troponin elevation likely d/t sepsis and renal failure.   eICU Interventions  Will order: 1. Bolus with 0.9 NaCl 1 liter IV over 1 hour now.     Intervention Category Major Interventions: Shock - evaluation and management;Sepsis - evaluation and management  [name] 11/20/2014, 6:54 PM

## 2014-11-20 NOTE — Progress Notes (Signed)
Critical troponin reported to Dr. Eliberto IvoryAustin

## 2014-11-20 NOTE — Progress Notes (Addendum)
FOLLOW UP NUTRITION ASSESSMENT  DOCUMENTATION CODES Per approved criteria  -Not Applicable   INTERVENTION:  Change TF to VITAL AF1.2 and increase to goal rate of 75 ml/h (1800 ml per day) to provide 2160 kcals, 135 gm protein, 1460 ml free water daily.  NUTRITION DIAGNOSIS: Inadequate oral intake related to inability to eat as evidenced by NPO status.   Goal: Intake to meet >90% of estimated nutrition needs.  Monitor:  TF initiation/tolerance/adequacy, weight trend, labs, vent status.  Reason for Assessment: VDRF  74 y.o. male  Admitting Dx: Sepsis  ASSESSMENT: Patient with history of seizure disorder; presented to the [facility] ED after being found down at home for an unknown period of time. [facility] on 3/18.  3/19: Consult for initiation of TF  Patient is currently intubated on ventilator support MV: 19.2 L/min Temp (24hrs), Avg:97.4 F (36.3 C), Min:88.7 F (31.5 C), Max:100.1 F (37.8 C)    Height: Ht Readings from Last 1 Encounters:  11/19/14 6' (1.829 m)    Weight: Wt Readings from Last 1 Encounters:  11/20/14 184 lb 11.9 oz (83.8 kg)    Ideal Body Weight: 80.9 kg  % Ideal Body Weight: 97%  Wt Readings from Last 10 Encounters:  11/20/14 184 lb 11.9 oz (83.8 kg)  11/18/12 185 lb (83.915 kg)  Dosing: 78.2 kg (from 3/18)  Usual Body Weight: 185 lbs (2 years ago)  % Usual Body Weight: 93%  BMI:  Body mass index is 25.05 kg/(m^2).  Estimated Nutritional Needs: Kcal: 2182 Protein: 117-133 g (1.5-1.7 g/kg) Fluid: >/= 2 L  Skin: stage 2 pressure ulcer to sacrum  Diet Order: Diet NPO time specified  EDUCATION NEEDS: -Education not appropriate at this time   Intake/Output Summary (Last 24 hours) at 11/20/14 0804 Last data filed at 11/20/14 0700  Gross per 24 hour  Intake 8186.56 ml  Output   1705 ml  Net 6481.56 ml    Last BM: unknown   Labs:   Recent Labs Lab 10/27/14 2105 11/19/14 0501 11/19/14 2259 11/20/14 0400  NA   --  143 143 144  K  --  4.0 4.7 4.1  CL  --  109 109 107  CO2  --  22 21 20   BUN  --  74* 81* 80*  CREATININE 2.05* 2.24* 3.59* 3.43*  CALCIUM  --  11.7* 10.7* 10.8*  MG 2.6*  --   --  2.5  PHOS 5.4*  --   --  6.4*  GLUCOSE  --  160* 175* 151*    CBG (last 3)   Recent Labs  11/20/14 0021 11/20/14 0405 11/20/14 0728  GLUCAP 161* 143* 122*    Scheduled Meds: . antiseptic oral rinse  7 mL Mouth Rinse QID  . aspirin  81 mg Per Tube Daily  . calcitonin (salmon)  1 spray Alternating Nares Daily  . chlorhexidine  15 mL Mouth Rinse BID  . feeding supplement (VITAL HIGH PROTEIN)  1,000 mL Per Tube Q24H  . folic acid  1 mg Oral Daily  . heparin  5,000 Units Subcutaneous 3 times per day  . hydrocortisone sodium succinate  50 mg Intravenous Q6H  . lactulose  20 g Oral 3 times per day  . multivitamin with minerals  1 tablet Oral Daily  . pantoprazole (PROTONIX) IV  40 mg Intravenous Q12H  . piperacillin-tazobactam  3.375 g Intravenous Q8H  . sodium chloride  3 mL Intravenous Q12H  . thiamine  100 mg Oral Daily  Or  . thiamine  100 mg Intravenous Daily  . vancomycin  1,000 mg Intravenous Q24H    Continuous Infusions: . sodium chloride 125 mL/hr at 11/20/14 0246  . amiodarone 30 mg/hr (11/20/14 0800)  . fentaNYL infusion INTRAVENOUS 100 mcg/hr (11/19/14 1859)  . midazolam (VERSED) infusion 1 mg/hr (11/20/14 0205)  . norepinephrine (LEVOPHED) Adult infusion 20 mcg/min (11/20/14 0317)  . phenylephrine (NEO-SYNEPHRINE) Adult infusion 200 mcg/min (11/20/14 0601)  . vasopressin (PITRESSIN) infusion - *FOR SHOCK* 0.03 Units/min (11/19/14 2231)    Past Medical History  Diagnosis Date  . Hypertension   . Seizures   . Chronic back pain     History reviewed. No pertinent past surgical history.   Christophe Louis RD, LDN Nutrition Pager: [phone] 11/20/2014 8:04 AM

## 2014-11-20 NOTE — Progress Notes (Signed)
CRITICAL VALUE ALERT  Critical value received:  Lactic acid 6.2  Date of notification:  11/20/14  Time of notification:  1718  Critical value read back:Yes.    Nurse who received alert:  Layne BentonJulian Jaqulyn Chancellor, RN  MD notified (1st page):  Dr. Arsenio LoaderSommer  Time of first page:  1723  MD notified (2nd page):  Time of second page:  Responding MD:  Dr. Arsenio LoaderSommer  Time MD responded:  [phone]

## 2014-11-20 NOTE — Progress Notes (Signed)
Spoke with POA, Micheal about code status, via phone call with Dirk DressKaty Whiteheart, NP.  POA decided to make patient DNR after explination of current condition.

## 2014-11-20 NOTE — Progress Notes (Signed)
*  PRELIMINARY RESULTS* Echocardiogram 2D Echocardiogram has been performed.  Jeryl ColumbiaLLIOTT, Helaine Yackel 11/20/2014, 10:08 AM

## 2014-11-21 ENCOUNTER — Inpatient Hospital Stay (HOSPITAL_COMMUNITY): Payer: Medicare Other

## 2014-11-21 LAB — POCT I-STAT 3, ART BLOOD GAS (G3+)
Acid-base deficit: 12 mmol/L — ABNORMAL HIGH (ref 0.0–2.0)
Acid-base deficit: 13 mmol/L — ABNORMAL HIGH (ref 0.0–2.0)
BICARBONATE: 13 meq/L — AB (ref 20.0–24.0)
BICARBONATE: 13.7 meq/L — AB (ref 20.0–24.0)
O2 SAT: 95 %
O2 Saturation: 95 %
PH ART: 7.23 — AB (ref 7.350–7.450)
PO2 ART: 94 mmHg (ref 80.0–100.0)
Patient temperature: 100
Patient temperature: 99.4
TCO2: 14 mmol/L (ref 0–100)
TCO2: 15 mmol/L (ref 0–100)
pCO2 arterial: 30.7 mmHg — ABNORMAL LOW (ref 35.0–45.0)
pCO2 arterial: 31.4 mmHg — ABNORMAL LOW (ref 35.0–45.0)
pH, Arterial: 7.259 — ABNORMAL LOW (ref 7.350–7.450)
pO2, Arterial: 90 mmHg (ref 80.0–100.0)

## 2014-11-21 LAB — BASIC METABOLIC PANEL
Anion gap: 25 — ABNORMAL HIGH (ref 5–15)
BUN: 84 mg/dL — ABNORMAL HIGH (ref 6–23)
CO2: 13 mmol/L — AB (ref 19–32)
Calcium: 9 mg/dL (ref 8.4–10.5)
Chloride: 107 mmol/L (ref 96–112)
Creatinine, Ser: 4.53 mg/dL — ABNORMAL HIGH (ref 0.50–1.35)
GFR calc Af Amer: 13 mL/min — ABNORMAL LOW (ref >90)
GFR, EST NON AFRICAN AMERICAN: 12 mL/min — AB (ref >90)
Glucose, Bld: 93 mg/dL (ref 70–99)
POTASSIUM: 5.6 mmol/L — AB (ref 3.5–5.1)
Sodium: 145 mmol/L (ref 135–145)

## 2014-11-21 LAB — CULTURE, RESPIRATORY: Culture: NORMAL

## 2014-11-21 LAB — CBC
HCT: 35.2 % — ABNORMAL LOW (ref 39.0–52.0)
HEMOGLOBIN: 11.4 g/dL — AB (ref 13.0–17.0)
MCH: 32.6 pg (ref 26.0–34.0)
MCHC: 32.4 g/dL (ref 30.0–36.0)
MCV: 100.6 fL — AB (ref 78.0–100.0)
PLATELETS: 110 10*3/uL — AB (ref 150–400)
RBC: 3.5 MIL/uL — ABNORMAL LOW (ref 4.22–5.81)
RDW: 18.1 % — ABNORMAL HIGH (ref 11.5–15.5)
WBC: 15.1 10*3/uL — AB (ref 4.0–10.5)

## 2014-11-21 LAB — TROPONIN I
TROPONIN I: 1.45 ng/mL — AB (ref <0.031)
Troponin I: 3.14 ng/mL (ref <0.031)

## 2014-11-21 LAB — CULTURE, RESPIRATORY W GRAM STAIN

## 2014-11-21 LAB — GLUCOSE, CAPILLARY
GLUCOSE-CAPILLARY: 89 mg/dL (ref 70–99)
Glucose-Capillary: 96 mg/dL (ref 70–99)

## 2014-11-21 LAB — PROTIME-INR
INR: 7.57 — AB (ref 0.00–1.49)
PROTHROMBIN TIME: 64.6 s — AB (ref 11.6–15.2)

## 2014-11-21 MED ORDER — PIPERACILLIN-TAZOBACTAM IN DEX 2-0.25 GM/50ML IV SOLN
2.2500 g | Freq: Three times a day (TID) | INTRAVENOUS | Status: DC
  Filled 2014-11-21 (×2): qty 50

## 2014-11-21 MED ORDER — VANCOMYCIN HCL IN DEXTROSE 1-5 GM/200ML-% IV SOLN: 1000.0000 mg | INTRAVENOUS | Status: DC

## 2014-11-21 MED ORDER — SODIUM BICARBONATE 8.4 % IV SOLN
INTRAVENOUS | Status: DC
  Administered 2014-11-21: 01:00:00 via INTRAVENOUS
  Filled 2014-11-21 (×3): qty 150

## 2014-11-21 MED ORDER — SODIUM BICARBONATE 8.4 % IV SOLN
100.0000 meq | Freq: Once | INTRAVENOUS | Status: AC
  Administered 2014-11-21: 100 meq via INTRAVENOUS
  Filled 2014-11-21: qty 100

## 2014-11-21 MED ORDER — MORPHINE SULFATE 25 MG/ML IV SOLN
1.0000 mg/h | INTRAVENOUS | Status: DC
  Administered 2014-11-21: 1 mg/h via INTRAVENOUS
  Filled 2014-11-21: qty 10

## 2014-11-22 LAB — PTH, INTACT AND CALCIUM
CALCIUM TOTAL (PTH): 11.8 mg/dL — AB (ref 8.6–10.2)
PTH: 27 pg/mL (ref 15–65)

## 2014-11-23 ENCOUNTER — Telehealth: Payer: Self-pay

## 2014-11-23 LAB — CULTURE, BLOOD (ROUTINE X 2)
CULTURE: NO GROWTH
Culture: NO GROWTH

## 2014-11-23 NOTE — Telephone Encounter (Signed)
Received cremation certificate 7/56/433/21/16 from [facility].  Faxing to Dr. Delton CoombesByrum at 2100 for signature.  Received back 11/24/14.  Faxed to [facility].  Awaiting original.  Received original 11/29/14.  Sending to Dr. Delton CoombesByrum for signature.  Received back.  [facility]/30/14.

## 2014-11-24 ENCOUNTER — Telehealth: Payer: Self-pay

## 2014-11-24 NOTE — Telephone Encounter (Signed)
Error

## 2014-12-03 NOTE — Progress Notes (Signed)
ELink MD Deterding notified re: critical Troponin, PT/INR

## 2014-12-03 NOTE — Procedures (Signed)
Extubation Procedure Note  Patient Details:   Name: Teresa CoombsWilliam Woodstock DOB: Apr 07, 1941 MRN: [id number]   Airway Documentation:     Evaluation  Pt terminally extubated per Md order.   Armando GangMike, Emanuell Morina C 08-09-2015, 3:25 PM
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
## 2014-12-03 NOTE — Progress Notes (Signed)
Controlled narcotic drips wasted in sink & witnessed by Philippa SicksMelinda Macasero RN: 5 ml Versed, 75 ml Fentanyl, & 240 ml Morphine.

## 2014-12-03 NOTE — Progress Notes (Signed)
eLink Physician-Brief Progress Note Patient Name: Teresa CoombsWilliam Odonell DOB: July 12, 1941 MRN: [id number]   Date of Service  2015-01-24  HPI/Events of Note  Continued metabolic acidosis despite vent changes.  Current pH of 7.25/30/90/13.7.  Patient vent mode was changed and he is tolerating the vent.  eICU Interventions  Plan: 2 amps of bicarb IVP Bicarb gtt Recheck ABG in AM     Intervention Category Major Interventions: Acid-Base disturbance - evaluation and management  DETERDING,ELIZABETH 2015-01-24, 12:46 AM

## 2014-12-03 NOTE — Progress Notes (Addendum)
Pt full DNR. Morphine gtt started, fent/bicarb gtt stopped. Waiting for minister to arrive before terminal extubation, but patient went asystole before minister arrived. Daughter, POA & friends at bedside.Time of death [id number]. Pt pulseless/no heart sounds auscultated. Confirmed by Crist FatJoy Vonita Calloway RN and Philippa SicksMelinda Macasero RN. Dirk DressKaty Whiteheart NP & Dr. Delton CoombesByrum notified.

## 2014-12-03 DEATH — deceased
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
## 2016-06-04 IMAGING — CT CT ABD-PELV W/O CM
2 of 4 series · 17 of 46 positions shown, 19 images · non-contrast
Comparison: None.

CLINICAL DATA: Elevated liver function tests.

EXAM:
CT ABDOMEN AND PELVIS WITHOUT CONTRAST
TECHNIQUE: Multidetector CT imaging of the abdomen and pelvis was performed
following the standard protocol without IV contrast.

[Series 2: abdomen/pelvis w/o contrast · axial · non-contrast · 0.76mm/px · z∈[+632,+1072]mm · 14 of 102 slices shown, 16 images]
[im 7/102  soft-tissue]
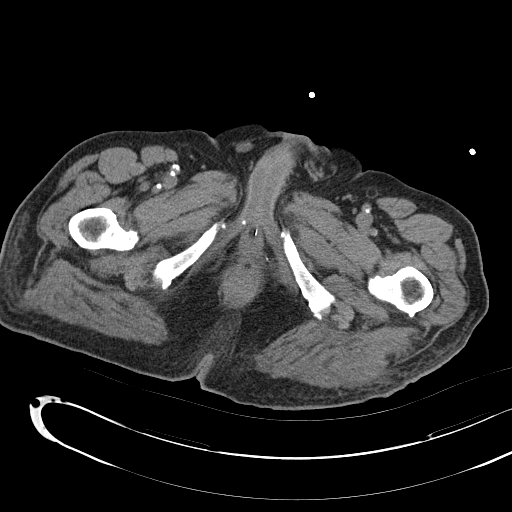
[im 7/102  bone]
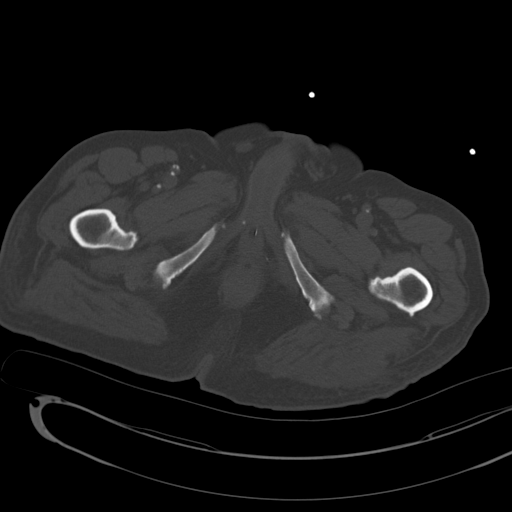
[im 14/102  soft-tissue]
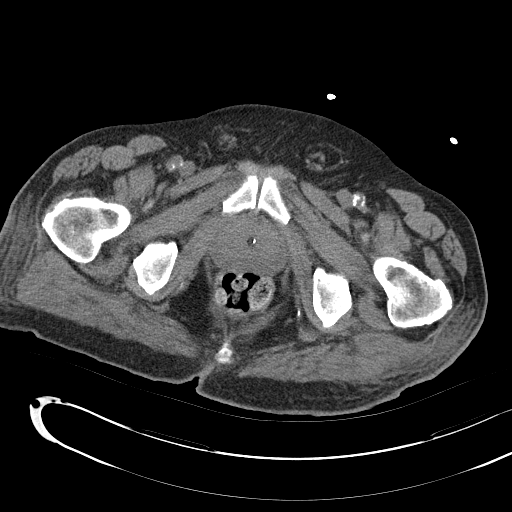
[im 21/102  soft-tissue]
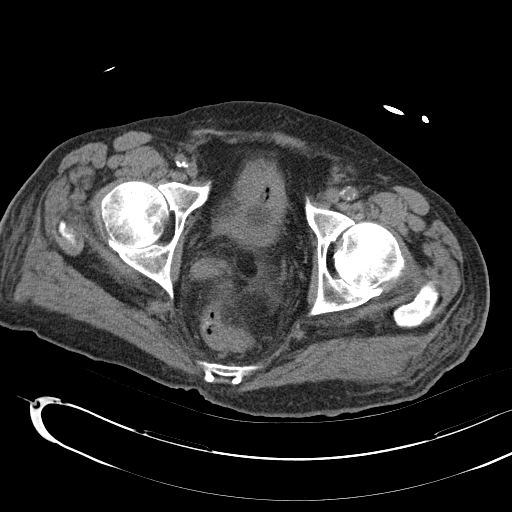
[im 27/102  soft-tissue]
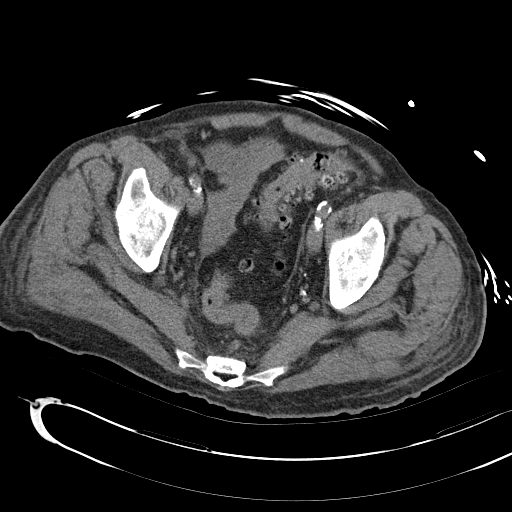
[im 34/102  soft-tissue]
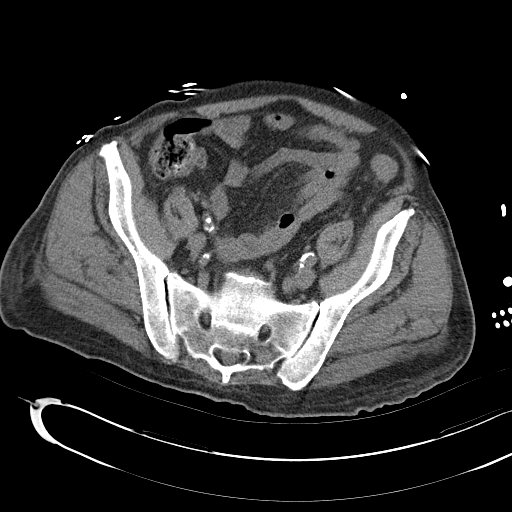
[im 41/102  soft-tissue]
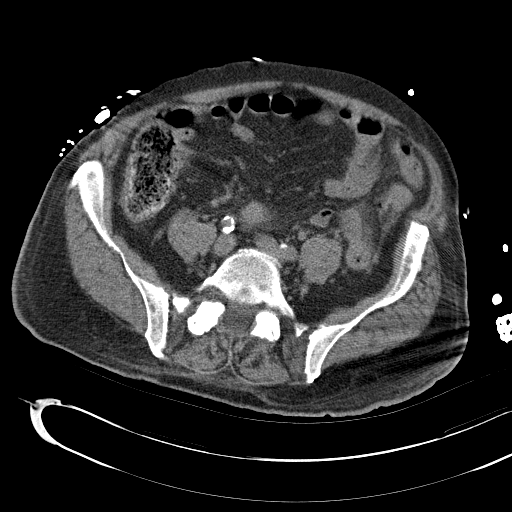
[im 48/102  soft-tissue]
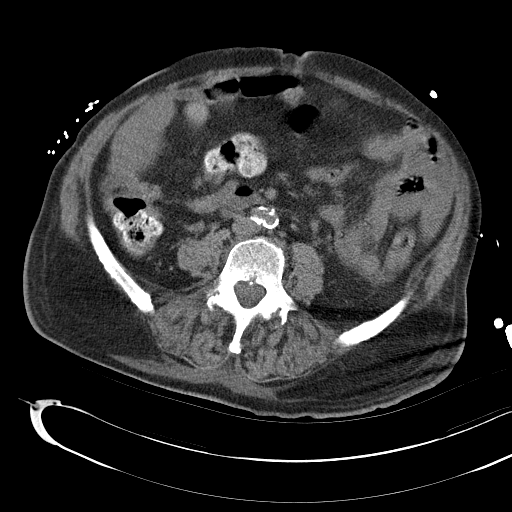
[im 54/102  soft-tissue]
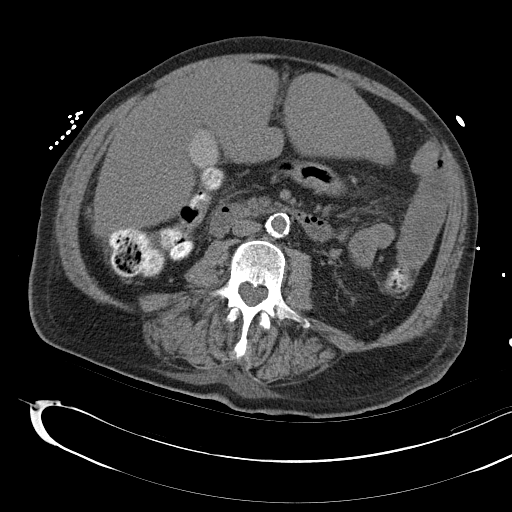
[im 61/102  soft-tissue]
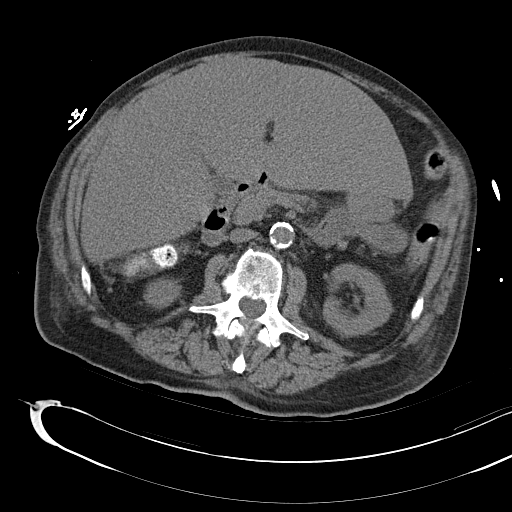
[im 61/102  bone]
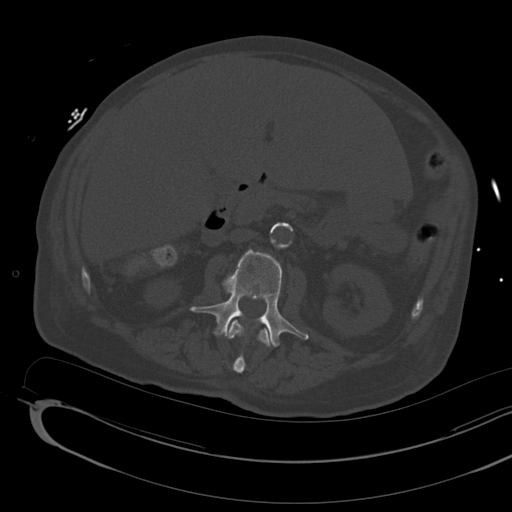
[im 68/102  soft-tissue]
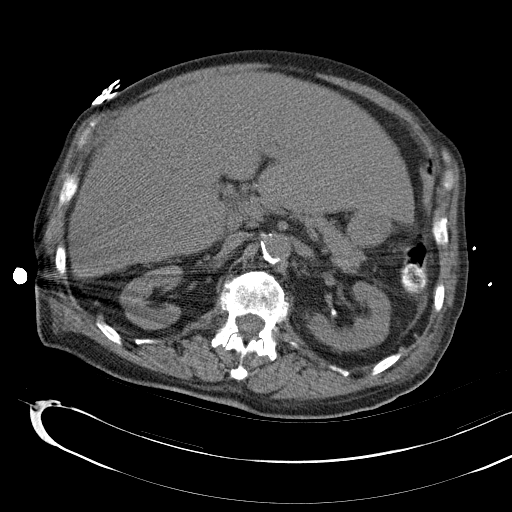
[im 75/102  soft-tissue]
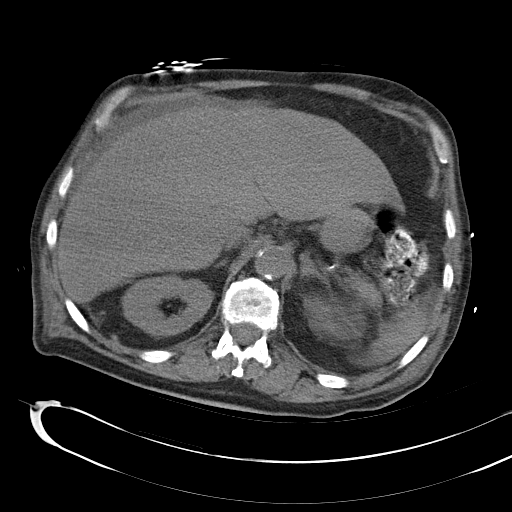
[im 81/102  soft-tissue]
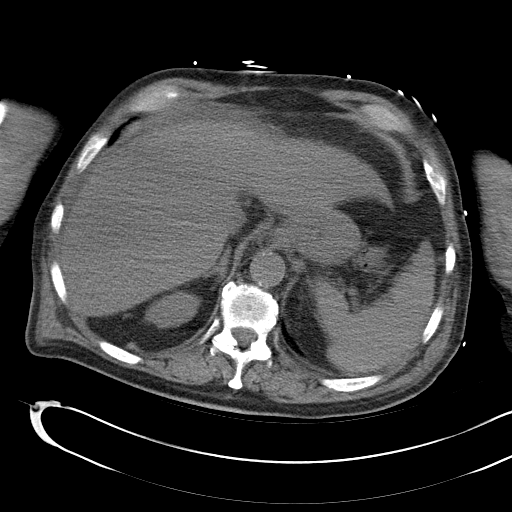
[im 88/102  soft-tissue]
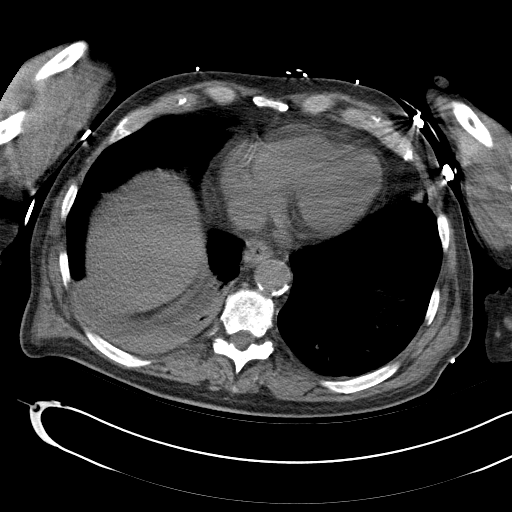
[im 95/102  soft-tissue]
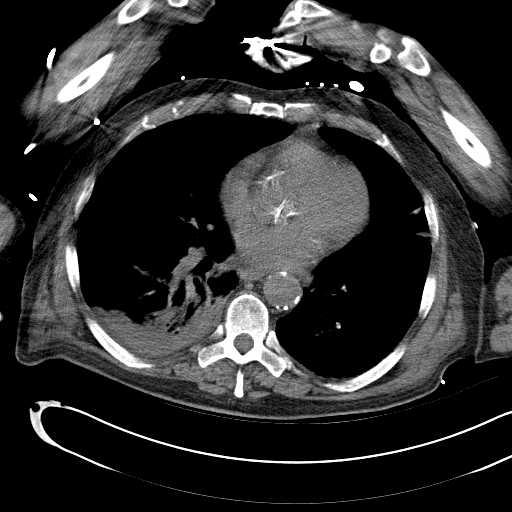

[Series 3: mpr cor (id) · coronal · 0.83mm/px · 3 of 100 slices shown]
[im 34/100  soft-tissue]
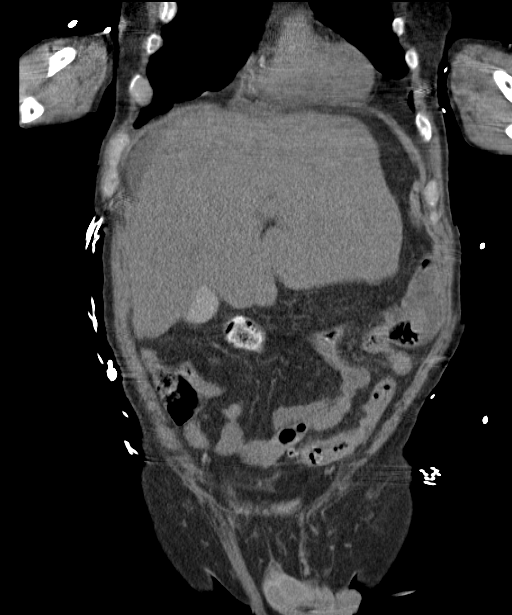
[im 45/100  soft-tissue]
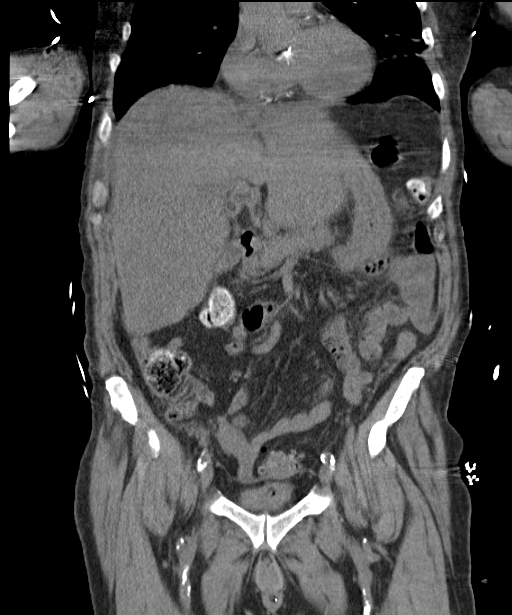
[im 56/100  soft-tissue]
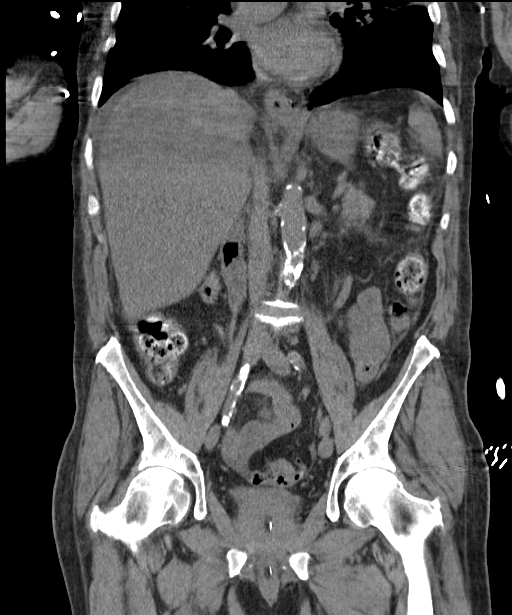

[17 of 46 positions shown; findings below may reference images not displayed]

FINDINGS: Multilevel degenerative disc disease is noted in the lumbar spine.
Mild right posterior basilar opacity is noted concerning for
pneumonia or subsegmental atelectasis.

Liver measures 26 cm in diameter consistent with hepatomegaly. The
spleen and pancreas appear normal. No definite gallstones or
gallbladder inflammation is noted. Adrenal glands and kidneys appear
normal. No hydronephrosis or renal obstruction is noted.
Atherosclerotic calcifications of abdominal aorta are noted without
aneurysm formation. There is no evidence of bowel obstruction.
Sigmoid diverticulosis is noted without inflammation. Urinary
bladder is decompressed secondary to Foley catheter. No abnormal
fluid collection is noted. No significant adenopathy is noted.
IMPRESSION: Hepatomegaly is noted without focal abnormality seen. Right lower
lobe pneumonia or atelectasis is noted. Sigmoid diverticulosis is
noted without inflammation.

## 2016-06-05 IMAGING — CR DG CHEST 1V PORT
1 series · 1 of 1 positions shown · non-contrast
Comparison: 11/19/2014 at [DATE]

CLINICAL DATA: Central line placement

EXAM:
PORTABLE CHEST - 1 VIEW

[AP]
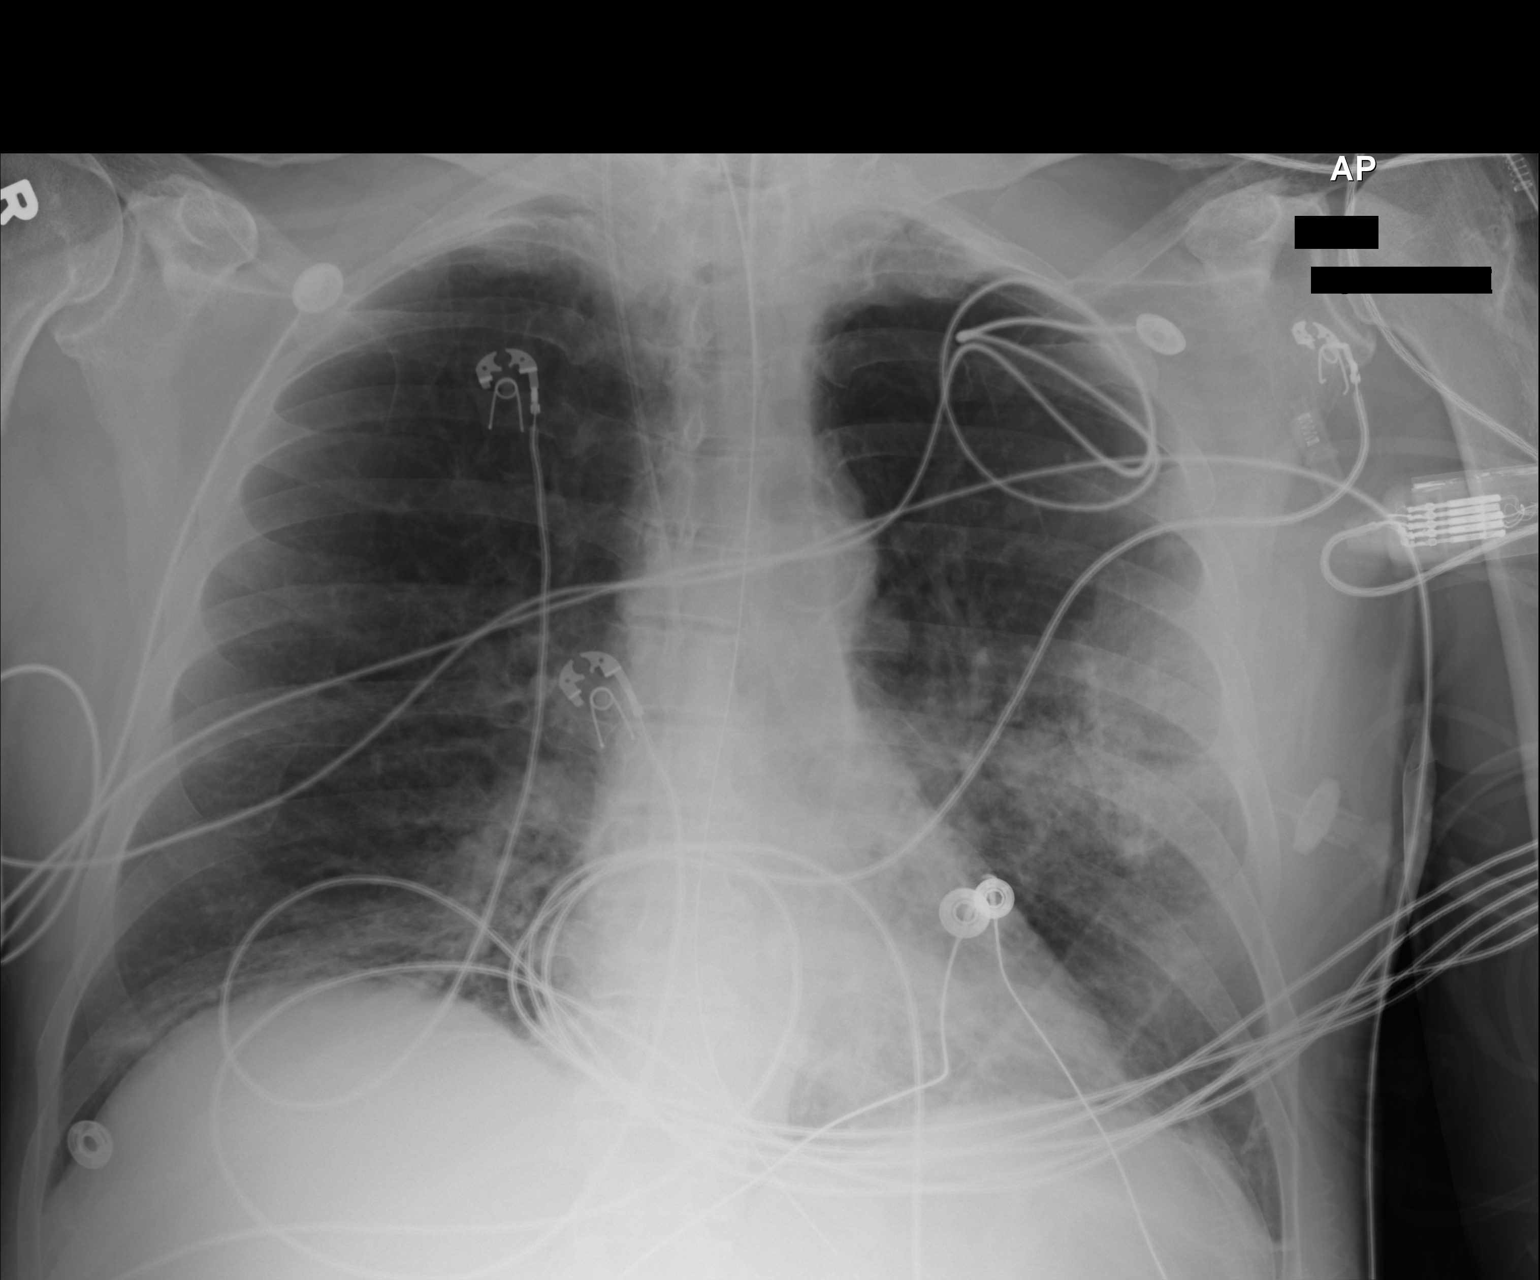

[1 of 1 positions shown; findings below may reference images not displayed]

FINDINGS: There is a new right jugular central line with tip in the SVC. There
is no pneumothorax. Endotracheal tube and nasogastric tube appear
unchanged. Patchy airspace opacity in the lateral left lung is again
evident, a little more conspicuous now. No large effusions.
IMPRESSION: Right jugular central line. No pneumothorax. Unchanged ET and NG
tubes. Left lateral lung airspace opacity is more conspicuous now.

## 2016-06-06 IMAGING — CR DG CHEST 1V PORT
1 series · 1 of 1 positions shown · non-contrast
Comparison: 11/19/2014

CLINICAL DATA: Followup intubated patient. History of hypertension
seizures. History of sepsis.

EXAM:
PORTABLE CHEST - 1 VIEW

[AP]
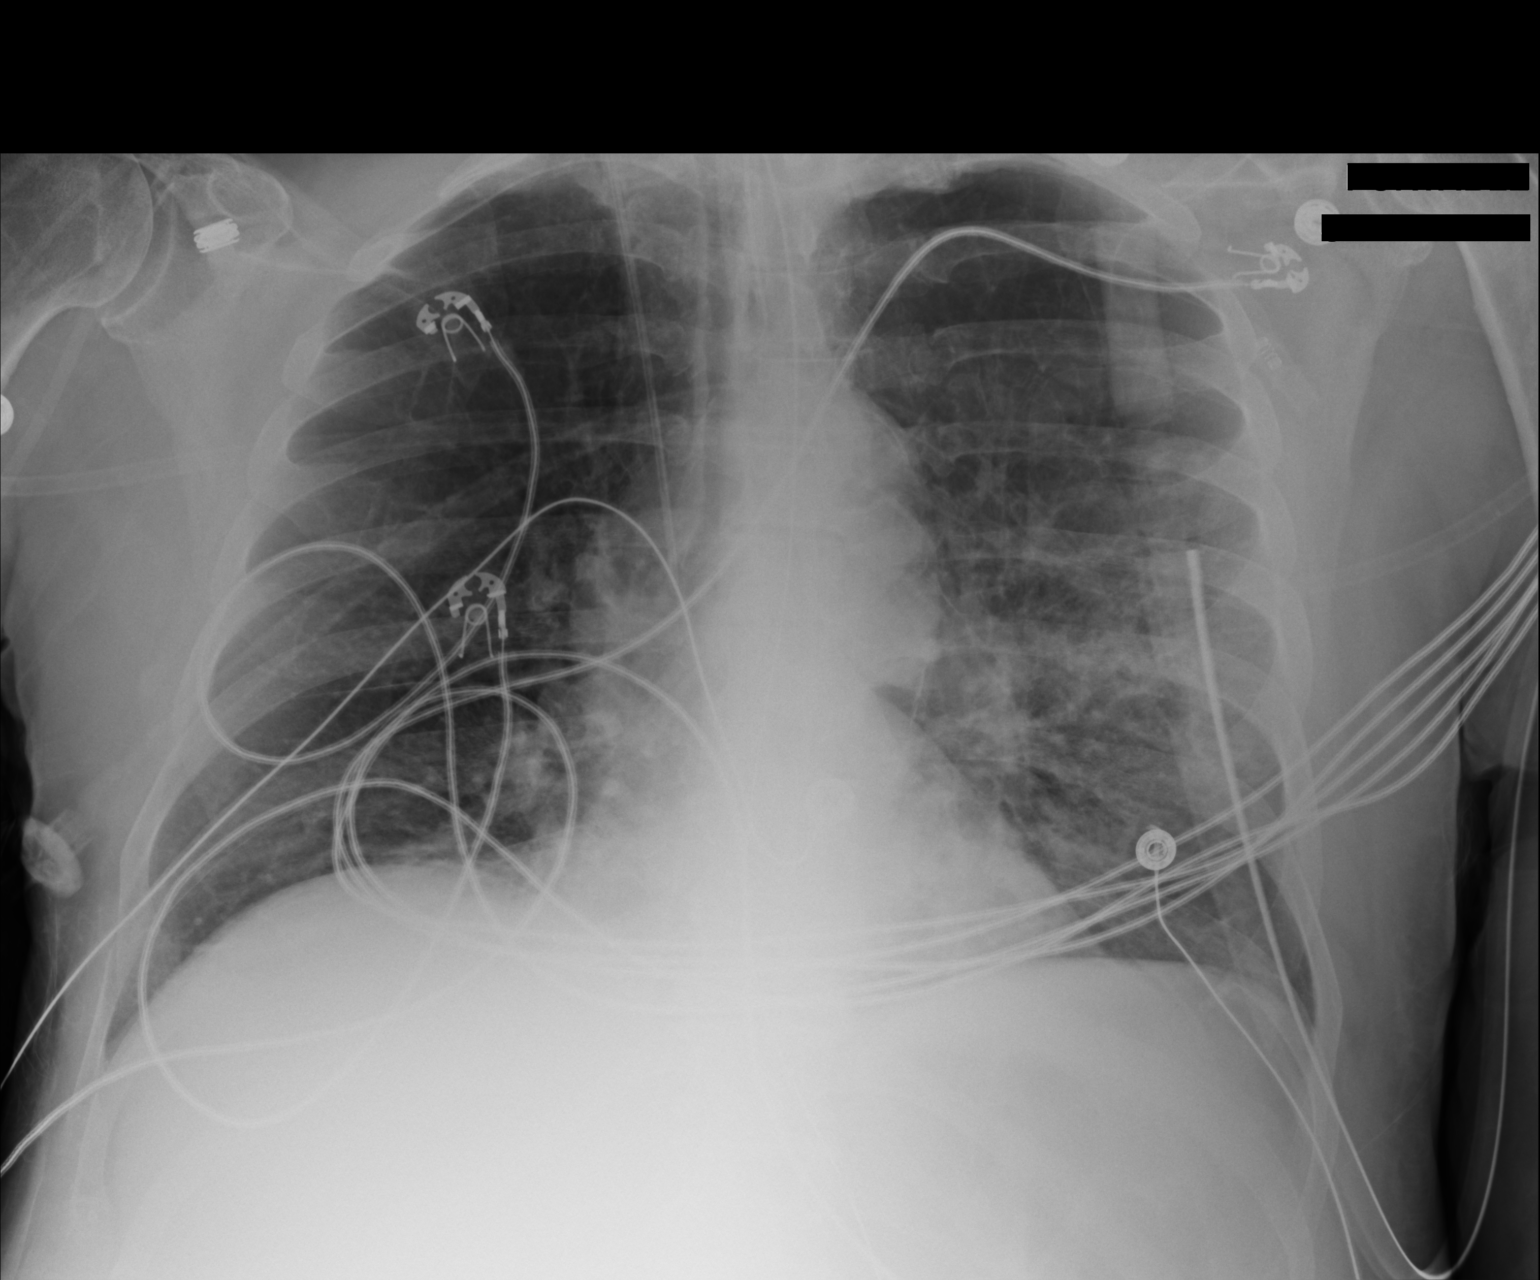

[1 of 1 positions shown; findings below may reference images not displayed]

FINDINGS: Patchy interstitial airspace opacities in the left mid and lower
lung appear less densely consolidated than it did on the previous
day's exam suggesting improvement. There are no new areas of lung
opacity

Endotracheal tube tip projects 4 cm above the Carina. Right internal
jugular central venous line and nasogastric tube are stable in well
positioned.
IMPRESSION: 1. Left lung airspace disease appears mildly improved when compared
to the previous day's study. No new abnormalities.
2. Support apparatus is well positioned.
